# Patient Record
Sex: Female | Born: 1960 | Race: White | Hispanic: No | Marital: Married | Smoking: Never smoker
Health system: Southern US, Community
[De-identification: ages and names within clinical notes are randomized; demographics above are authoritative.]

## PROBLEM LIST (undated history)

## (undated) DIAGNOSIS — E039 Hypothyroidism, unspecified: Secondary | ICD-10-CM

## (undated) DIAGNOSIS — C801 Malignant (primary) neoplasm, unspecified: Secondary | ICD-10-CM

## (undated) DIAGNOSIS — D649 Anemia, unspecified: Secondary | ICD-10-CM

## (undated) DIAGNOSIS — J40 Bronchitis, not specified as acute or chronic: Secondary | ICD-10-CM

## (undated) DIAGNOSIS — D229 Melanocytic nevi, unspecified: Secondary | ICD-10-CM

## (undated) HISTORY — PX: OTHER SURGICAL HISTORY: SHX169

## (undated) HISTORY — PX: HERNIA REPAIR: SHX51

## (undated) HISTORY — PX: TUBAL LIGATION: SHX77

## (undated) HISTORY — PX: TONSILLECTOMY: SUR1361

## (undated) HISTORY — DX: Melanocytic nevi, unspecified: D22.9

## (undated) HISTORY — PX: BREAST RECONSTRUCTION: SHX9

---

## 2004-10-11 ENCOUNTER — Ambulatory Visit: Payer: Self-pay | Admitting: Obstetrics and Gynecology

## 2006-01-23 ENCOUNTER — Ambulatory Visit: Payer: Self-pay | Admitting: Obstetrics and Gynecology

## 2007-03-24 ENCOUNTER — Ambulatory Visit: Payer: Self-pay | Admitting: Obstetrics and Gynecology

## 2008-03-25 ENCOUNTER — Ambulatory Visit: Payer: Self-pay | Admitting: Obstetrics and Gynecology

## 2009-06-23 ENCOUNTER — Ambulatory Visit: Payer: Self-pay | Admitting: Obstetrics and Gynecology

## 2010-06-27 ENCOUNTER — Ambulatory Visit: Payer: Self-pay | Admitting: Obstetrics and Gynecology

## 2011-07-03 ENCOUNTER — Ambulatory Visit: Payer: Self-pay | Admitting: Obstetrics and Gynecology

## 2011-12-07 ENCOUNTER — Ambulatory Visit: Payer: Self-pay | Admitting: Unknown Physician Specialty

## 2012-07-15 ENCOUNTER — Ambulatory Visit: Payer: Self-pay | Admitting: Obstetrics and Gynecology

## 2012-07-31 ENCOUNTER — Ambulatory Visit: Payer: Self-pay | Admitting: Otolaryngology

## 2012-08-27 HISTORY — PX: MASTECTOMY: SHX3

## 2013-04-16 ENCOUNTER — Ambulatory Visit: Payer: Self-pay | Admitting: Obstetrics and Gynecology

## 2013-05-04 LAB — PATHOLOGY REPORT

## 2014-02-23 ENCOUNTER — Emergency Department: Payer: Self-pay | Admitting: Emergency Medicine

## 2014-02-23 LAB — URINALYSIS, COMPLETE
Bacteria: NONE SEEN
Bilirubin,UR: NEGATIVE
Blood: NEGATIVE
GLUCOSE, UR: NEGATIVE mg/dL (ref 0–75)
Ketone: NEGATIVE
Leukocyte Esterase: NEGATIVE
Nitrite: NEGATIVE
PH: 6 (ref 4.5–8.0)
Protein: NEGATIVE
RBC,UR: NONE SEEN /HPF (ref 0–5)
Specific Gravity: 1.004 (ref 1.003–1.030)
Squamous Epithelial: <1
WBC UR: NONE SEEN /HPF (ref 0–5)

## 2014-02-23 LAB — CBC
HCT: 41.4 % (ref 35.0–47.0)
HGB: 13.4 g/dL (ref 12.0–16.0)
MCH: 28.6 pg (ref 26.0–34.0)
MCHC: 32.4 g/dL (ref 32.0–36.0)
MCV: 88 fL (ref 80–100)
Platelet: 205 10*3/uL (ref 150–440)
RBC: 4.68 10*6/uL (ref 3.80–5.20)
RDW: 13.1 % (ref 11.5–14.5)
WBC: 5.8 10*3/uL (ref 3.6–11.0)

## 2014-02-23 LAB — COMPREHENSIVE METABOLIC PANEL
ALK PHOS: 37 U/L — AB
AST: 18 U/L (ref 15–37)
Albumin: 3.5 g/dL (ref 3.4–5.0)
Anion Gap: 12 (ref 7–16)
BILIRUBIN TOTAL: 0.4 mg/dL (ref 0.2–1.0)
BUN: 9 mg/dL (ref 7–18)
CHLORIDE: 106 mmol/L (ref 98–107)
CO2: 21 mmol/L (ref 21–32)
Calcium, Total: 8.8 mg/dL (ref 8.5–10.1)
Creatinine: 0.71 mg/dL (ref 0.60–1.30)
EGFR (African American): >60
EGFR (Non-African Amer.): >60
Glucose: 104 mg/dL — ABNORMAL HIGH (ref 65–99)
Osmolality: 277 (ref 275–301)
Potassium: 3.8 mmol/L (ref 3.5–5.1)
SGPT (ALT): 15 U/L (ref 12–78)
Sodium: 139 mmol/L (ref 136–145)
Total Protein: 6.6 g/dL (ref 6.4–8.2)

## 2014-02-23 LAB — TROPONIN I: Troponin-I: <0.02 ng/mL

## 2014-02-23 LAB — D-DIMER(ARMC): D-DIMER: 911 ng/mL

## 2014-02-23 LAB — LIPASE, BLOOD: LIPASE: 169 U/L (ref 73–393)

## 2014-05-19 ENCOUNTER — Emergency Department: Payer: Self-pay | Admitting: Emergency Medicine

## 2014-05-20 LAB — CBC WITH DIFFERENTIAL/PLATELET
Basophil #: 0 10*3/uL (ref 0.0–0.1)
Basophil %: 0.6 %
EOS ABS: 0.3 10*3/uL (ref 0.0–0.7)
EOS PCT: 4.2 %
HCT: 35.9 % (ref 35.0–47.0)
HGB: 12 g/dL (ref 12.0–16.0)
LYMPHS ABS: 2.1 10*3/uL (ref 1.0–3.6)
LYMPHS PCT: 31.9 %
MCH: 29.5 pg (ref 26.0–34.0)
MCHC: 33.3 g/dL (ref 32.0–36.0)
MCV: 89 fL (ref 80–100)
MONOS PCT: 8.7 %
Monocyte #: 0.6 x10 3/mm (ref 0.2–0.9)
Neutrophil #: 3.6 10*3/uL (ref 1.4–6.5)
Neutrophil %: 54.6 %
Platelet: 218 10*3/uL (ref 150–440)
RBC: 4.05 10*6/uL (ref 3.80–5.20)
RDW: 13 % (ref 11.5–14.5)
WBC: 6.5 10*3/uL (ref 3.6–11.0)

## 2014-05-20 LAB — BASIC METABOLIC PANEL
ANION GAP: 8 (ref 7–16)
BUN: 12 mg/dL (ref 7–18)
CALCIUM: 7.9 mg/dL — AB (ref 8.5–10.1)
CHLORIDE: 109 mmol/L — AB (ref 98–107)
CREATININE: 0.6 mg/dL (ref 0.60–1.30)
Co2: 23 mmol/L (ref 21–32)
EGFR (African American): >60
EGFR (Non-African Amer.): >60
Glucose: 101 mg/dL — ABNORMAL HIGH (ref 65–99)
Osmolality: 279 (ref 275–301)
POTASSIUM: 3.7 mmol/L (ref 3.5–5.1)
Sodium: 140 mmol/L (ref 136–145)

## 2014-05-20 LAB — TSH: THYROID STIMULATING HORM: 2.23 u[IU]/mL

## 2014-07-16 ENCOUNTER — Ambulatory Visit: Payer: Self-pay | Admitting: Unknown Physician Specialty

## 2014-07-25 IMAGING — US ULTRASOUND LEFT BREAST
1 series · 9 of 9 positions shown · non-contrast
Comparison: Previous examinations, the most recent dated
07/15/2012.

REASON FOR EXAM: LT BR MASS 11 OCLOCK
COMMENTS:

PROCEDURE:     MAM - MAM US BREAST LEFT  - April 16, 2013  [DATE]
CLINICAL DATA: Mass felt by the patient in the upper inner left
breast for the past 5 days.
DIGITAL DIAGNOSTIC LEFT MAMMOGRAM WITH CAD AND LEFT BREAST
ULTRASOUND:

[Series 1: ultrasound left breast · 0.08mm/px · 9 of 9 slices shown]
[im 1/9]
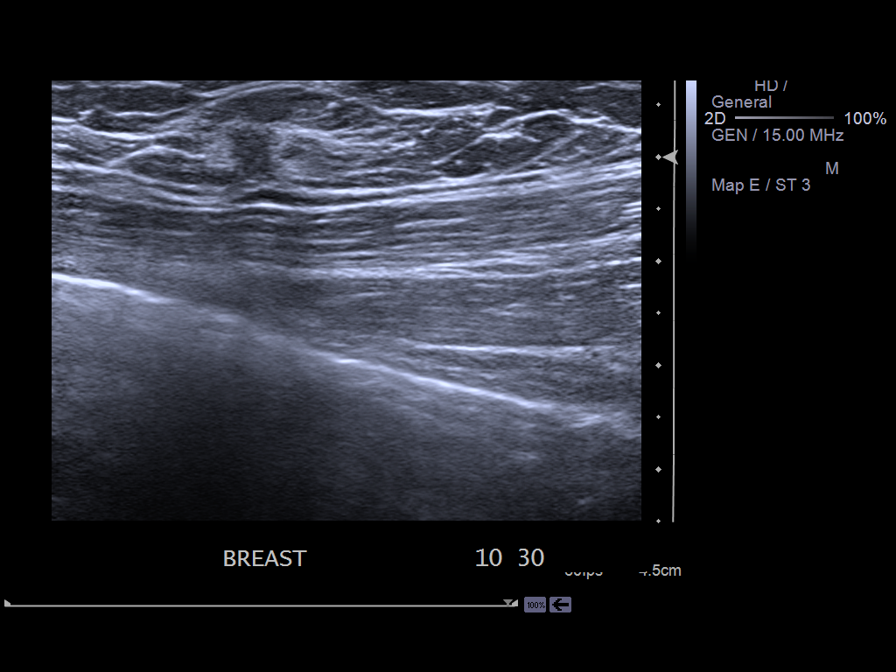
[im 2/9]
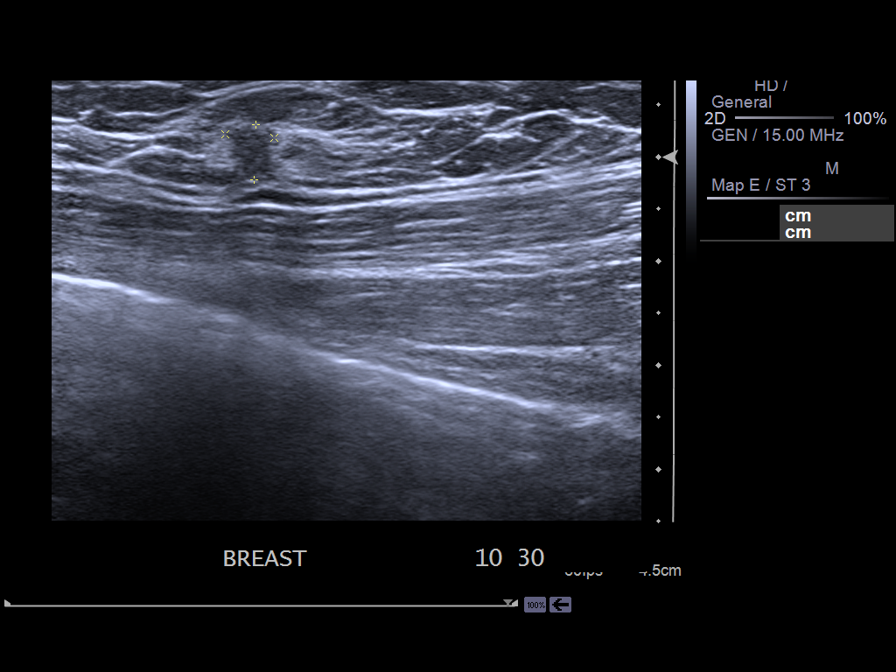
[im 3/9]
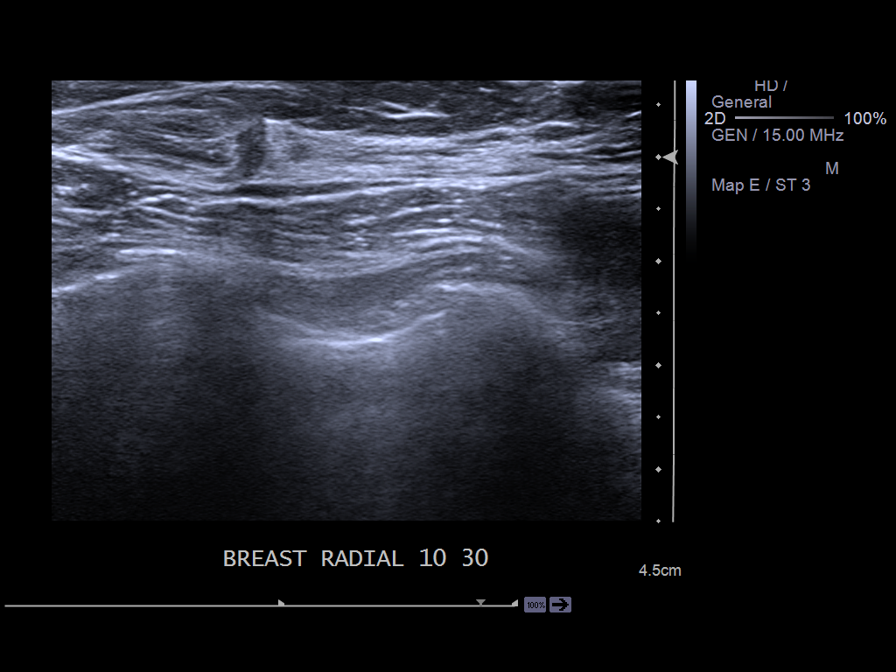
[im 4/9]
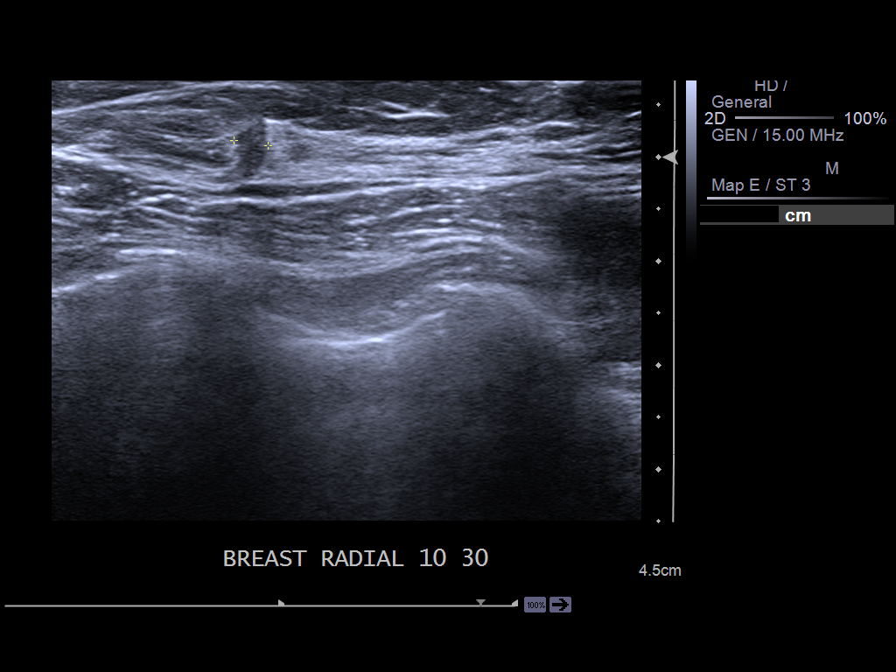
[im 5/9]
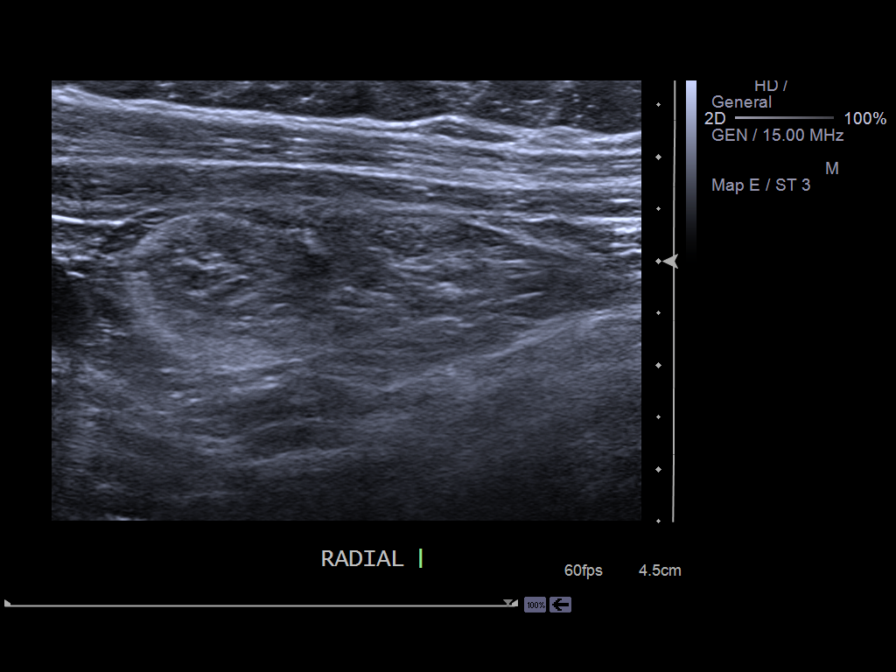
[im 6/9]
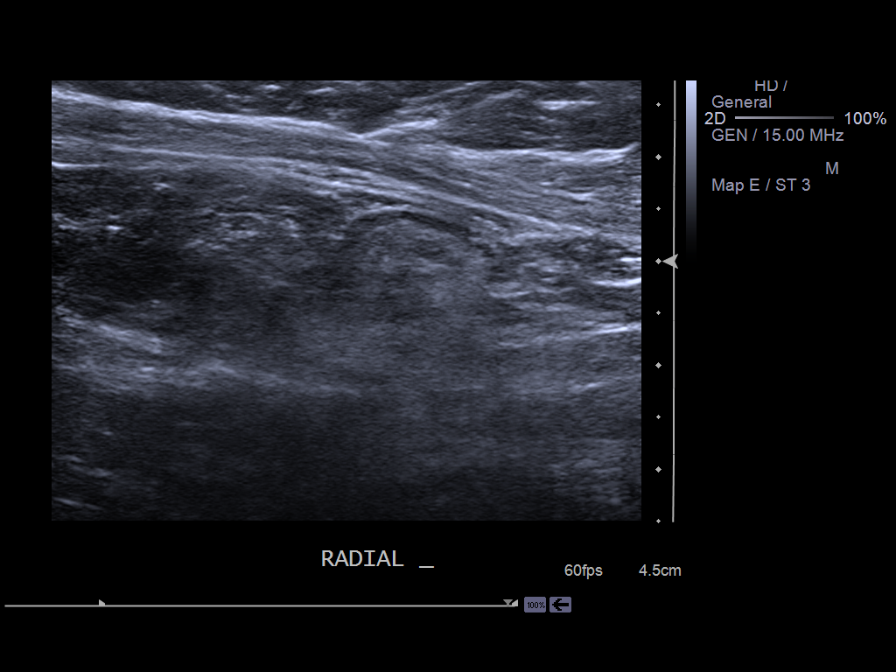
[im 7/9]
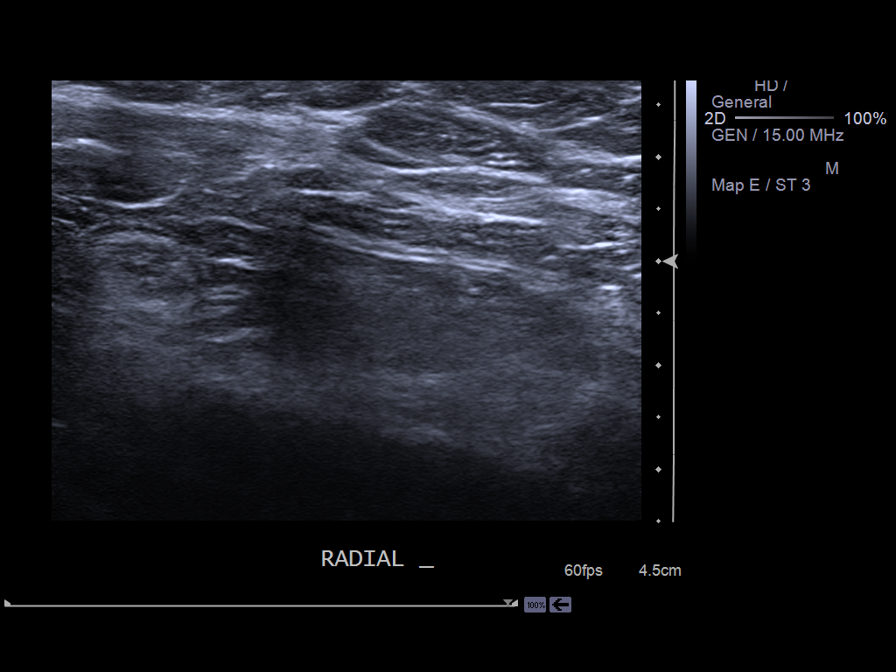
[im 8/9]
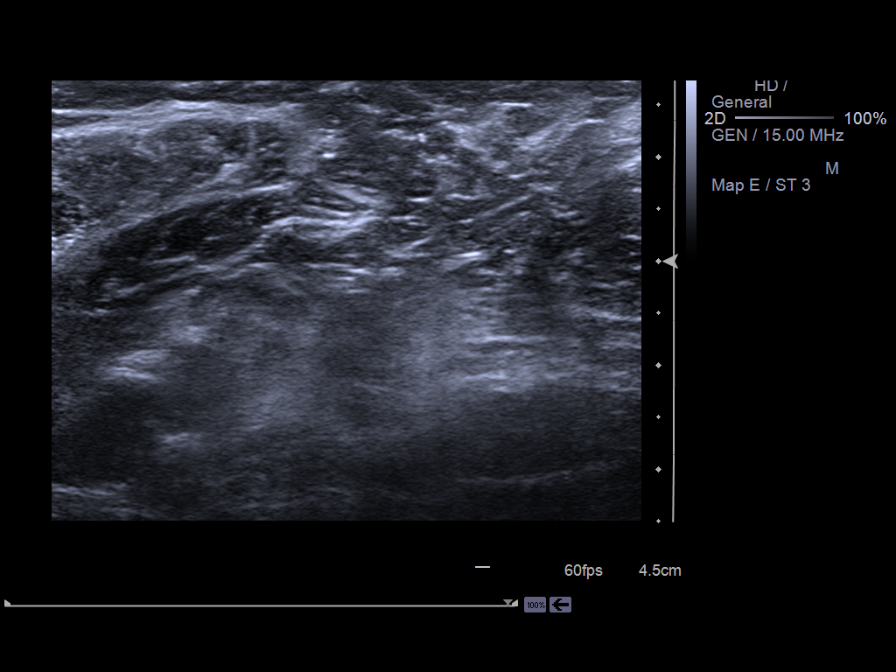
[im 9/9]
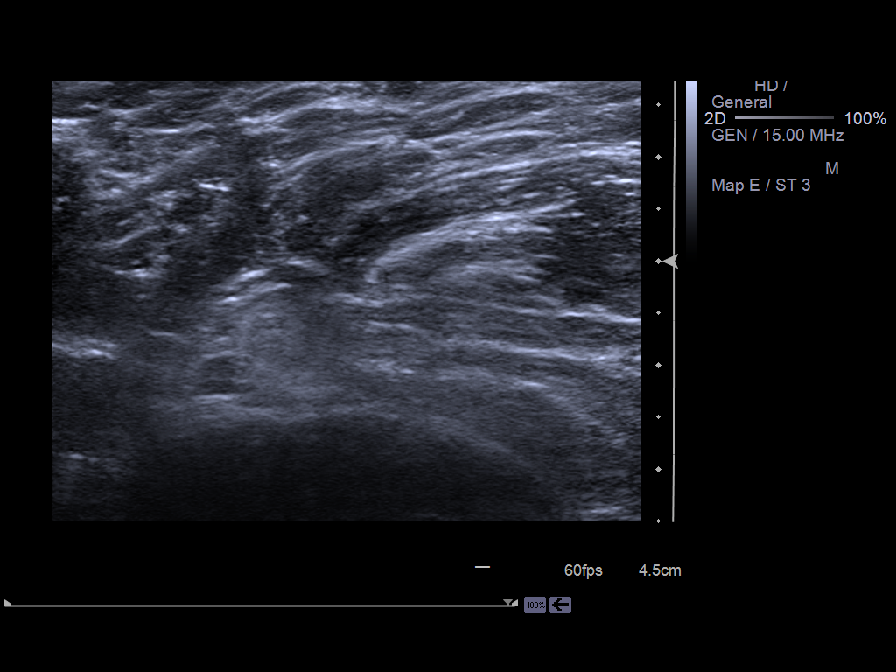

[9 of 9 positions shown; findings below may reference images not displayed]

FINDINGS: ACR Breast Density Category c:  The breast tissue is
heterogeneously dense, which may obscure small masses.

A subpectoral saline implant remains intact.  No discrete mass is
visible mammographically on these views, limited by the implant.

Mammographic images were processed with CAD.

On physical exam, the patient has an approximately 2 cm oval
palpable area of soft tissue thickening in the 10:30 o'clock
position of the left breast, 10 cm from the nipple.  There are no
palpable left axillary lymph nodes.

Ultrasound is performed, showing a 5 x 5 x 3 mm oval, vertically
oriented, irregularly marginated and poorly defined hypoechoic mass
in the 10:30 o'clock position of the left breast, 10 cm from the
nipple.  There is an ill defined surrounding echogenic halo.
Normal appearing left axillary lymph nodes were seen.
IMPRESSION: 5 x 5 x 3 mm mass in the 10:30 o'clock position of the left breast
with ultrasound features suspicious for malignancy.  Ultrasound-
guided core needle biopsy is recommended and scheduled for later
today.

RECOMMENDATION:
Left breast ultrasound guided core needle biopsy (scheduled for
later today).

I have discussed the findings and recommendations with the patient.
Results were also provided in writing at the conclusion of the
visit.  If applicable, a reminder letter will be sent to the
patient regarding the next appointment.

BI-RADS CATEGORY 5:  Highly suggestive of malignancy - appropriate
action should be taken.

## 2014-07-25 IMAGING — MG US BIOPSY BREAST CORE W/ IMAGING
1 series · 2 of 2 positions shown · non-contrast
Comparison: none

REASON FOR EXAM: us bx lt mass
COMMENTS:
CLINICAL DATA: 5 mm palpable mass in the upper inner quadrant of
the left breast with ultrasound features suspicious for malignancy.

[L CC · left · 2 of 2 slices shown]
[im 1/2]
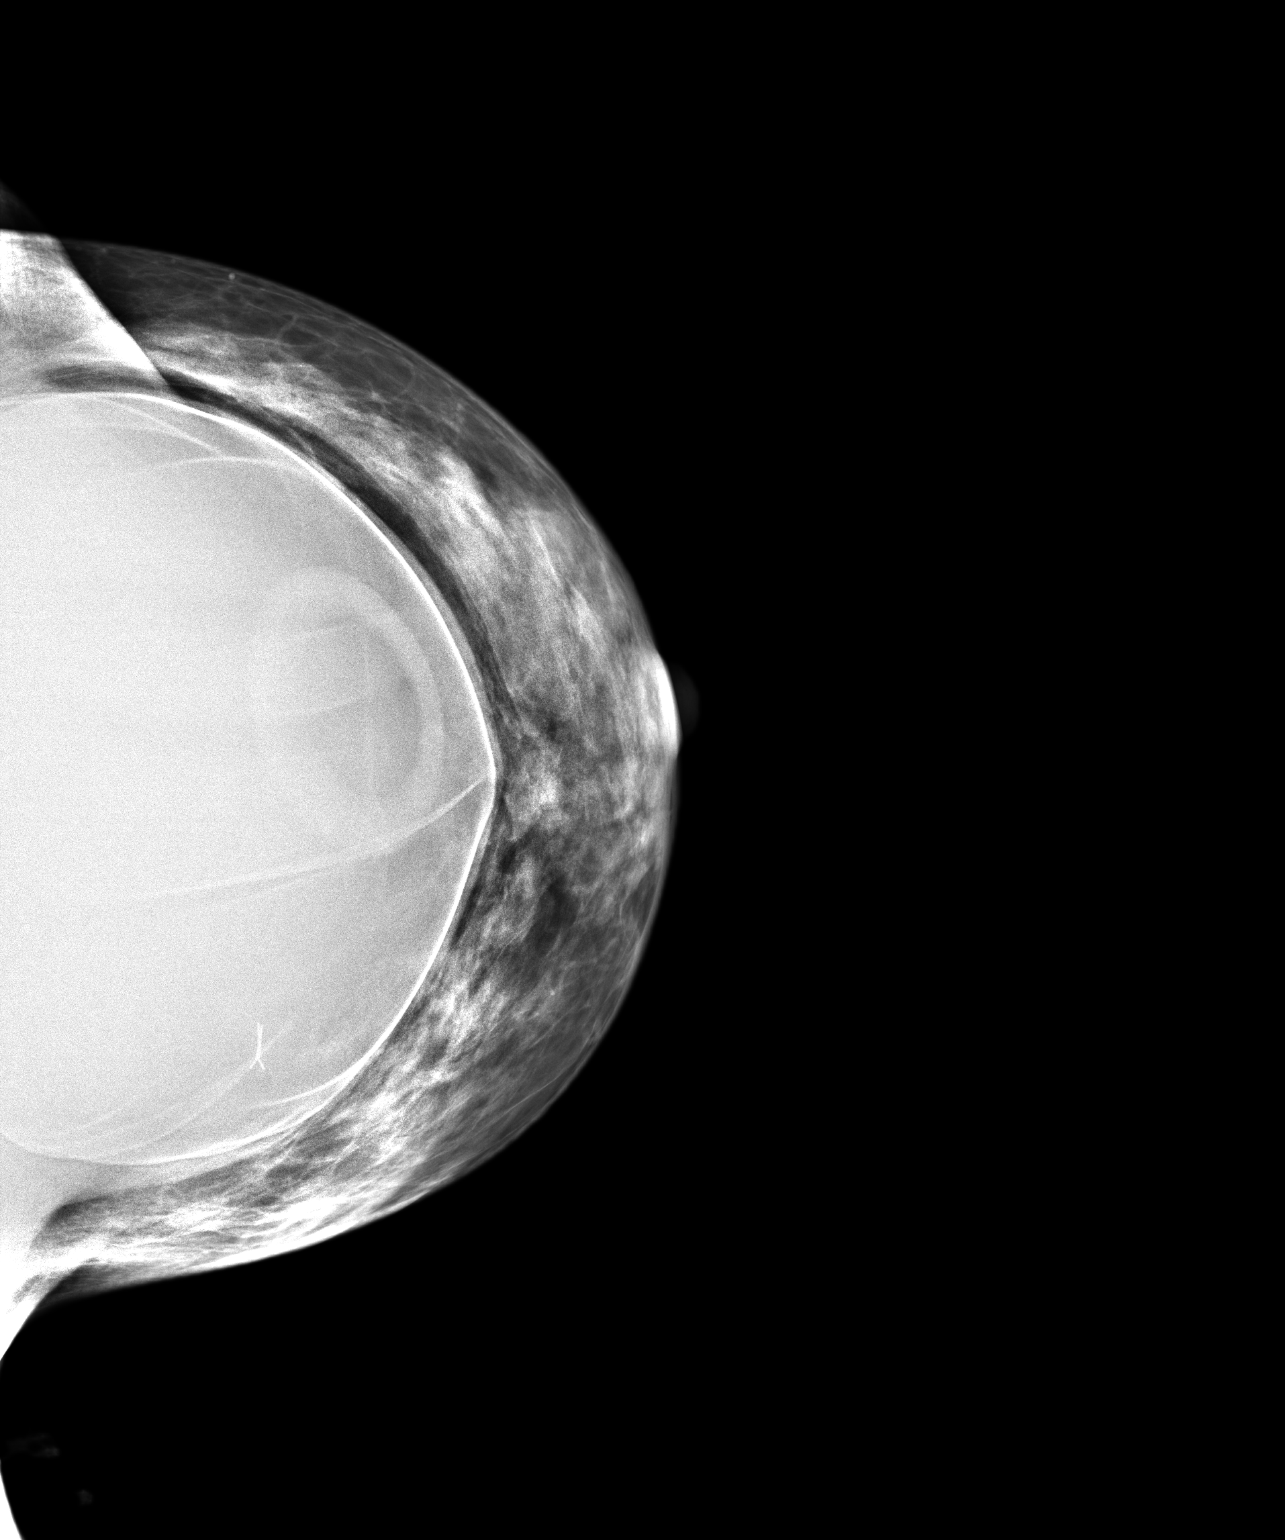
[im 2/2]
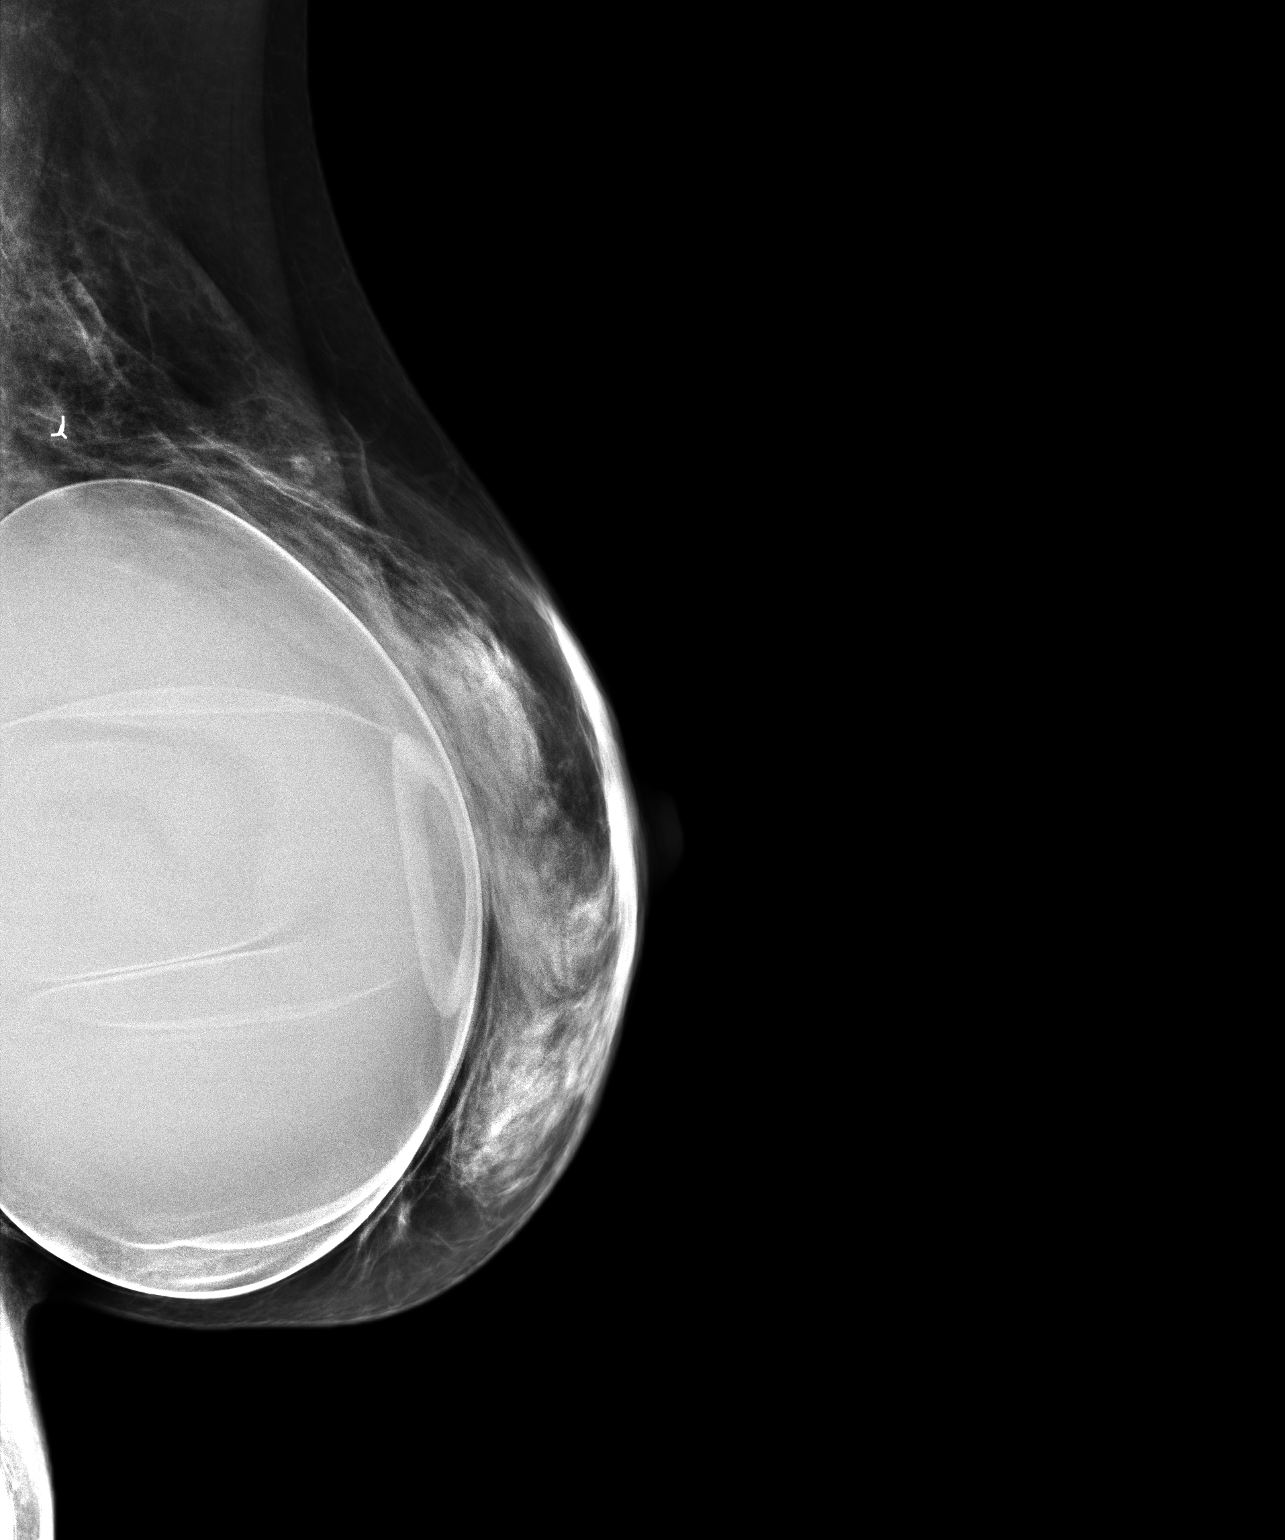

[2 of 2 positions shown; findings below may reference images not displayed]

PROCEDURE:     MAM - MAM US CORE BIOPSY LEFT  - April 16, 2013 [DATE]

***ADDENDUM*** CREATED: 04/21/2013 [DATE]

The final pathological diagnosis is invasive mammary carcinoma.
This is concordant with the imaging findings.

The final pathological diagnosis was discussed with the patient by
telephone on 04/21/2013.  Her questions were answered.  She reported
some pain and swelling at the biopsy site with no bruising or
palpable hematoma.

She reported discussing the final pathological diagnosis with Dr.
Katsunari yesterday.  He is arranging a follow-up treatment plan.
ULTRASOUND GUIDED VACUUM ASSISTED CORE BIOPSY OF THE LEFT BREAST

I met with the patient, and we discussed the procedure of
ultrasound-guided biopsy, including risks.  Specifically, we
discussed the risks of bleeding, infection and clip migration.  We
discussed the high likelihood of a successful procedure.  Informed,
written consent was given.

Using sterile technique, local anesthesia, ultrasound guidance, and
a 12 gauge vacuum assisted needle, biopsy was performed of the 5 mm
mass seen in the 10:30 o'clock position of the left breast at
ultrasound earlier today.  A medial approach was used.  At the
conclusion of the procedure, a tissue marker clip was deployed into
the biopsy cavity.  A follow-up 2-view mammogram was performed and
dictated separately.
IMPRESSION: Ultrasound-guided biopsy of a 5 mm mass in the 10:30 o'clock
position of the left breast.  No apparent complications.

## 2014-12-20 LAB — SURGICAL PATHOLOGY

## 2015-02-10 ENCOUNTER — Encounter
Admission: RE | Admit: 2015-02-10 | Discharge: 2015-02-10 | Disposition: A | Discharge status: Home / Self Care | Payer: 59 | Source: Ambulatory Visit | Attending: Obstetrics and Gynecology | Admitting: Obstetrics and Gynecology

## 2015-02-10 DIAGNOSIS — Z01812 Encounter for preprocedural laboratory examination: Secondary | ICD-10-CM | POA: Insufficient documentation

## 2015-02-10 DIAGNOSIS — Z853 Personal history of malignant neoplasm of breast: Secondary | ICD-10-CM | POA: Insufficient documentation

## 2015-02-10 DIAGNOSIS — Z880 Allergy status to penicillin: Secondary | ICD-10-CM | POA: Diagnosis not present

## 2015-02-10 DIAGNOSIS — Z9010 Acquired absence of unspecified breast and nipple: Secondary | ICD-10-CM | POA: Diagnosis not present

## 2015-02-10 DIAGNOSIS — E039 Hypothyroidism, unspecified: Secondary | ICD-10-CM | POA: Insufficient documentation

## 2015-02-10 DIAGNOSIS — J40 Bronchitis, not specified as acute or chronic: Secondary | ICD-10-CM | POA: Insufficient documentation

## 2015-02-10 HISTORY — DX: Malignant (primary) neoplasm, unspecified: C80.1

## 2015-02-10 HISTORY — DX: Bronchitis, not specified as acute or chronic: J40

## 2015-02-10 HISTORY — DX: Hypothyroidism, unspecified: E03.9

## 2015-02-10 LAB — CBC
HCT: 38.1 % (ref 35.0–47.0)
Hemoglobin: 12.5 g/dL (ref 12.0–16.0)
MCH: 29.4 pg (ref 26.0–34.0)
MCHC: 32.8 g/dL (ref 32.0–36.0)
MCV: 89.6 fL (ref 80.0–100.0)
PLATELETS: 201 10*3/uL (ref 150–440)
RBC: 4.25 MIL/uL (ref 3.80–5.20)
RDW: 13.7 % (ref 11.5–14.5)
WBC: 6.4 10*3/uL (ref 3.6–11.0)

## 2015-02-10 LAB — BASIC METABOLIC PANEL
Anion gap: 3 — ABNORMAL LOW (ref 5–15)
BUN: 13 mg/dL (ref 6–20)
CHLORIDE: 107 mmol/L (ref 101–111)
CO2: 25 mmol/L (ref 22–32)
Calcium: 8.9 mg/dL (ref 8.9–10.3)
Creatinine, Ser: 0.68 mg/dL (ref 0.44–1.00)
GFR calc Af Amer: >60 mL/min (ref >60)
GFR calc non Af Amer: >60 mL/min (ref >60)
GLUCOSE: 94 mg/dL (ref 65–99)
POTASSIUM: 3.7 mmol/L (ref 3.5–5.1)
Sodium: 135 mmol/L (ref 135–145)

## 2015-02-10 LAB — TYPE AND SCREEN
ABO/RH(D): O POS
Antibody Screen: NEGATIVE

## 2015-02-10 NOTE — H&P (Signed)
Judith West is a 54 y.o. female here for fractional D+C and hysteroscopy  .PMB on tamoxifen . Attempted EMBX without identifiable tissue 10/2014, u/s 12/02/14 shows stripe to be 4.9 mm Pt continues to have bleeding .    Past Medical History:  has a past medical history of Cancer; Thyroid disease; Acid reflux; Vitamin D deficiency; Breast CA; History of abdominal hernia; Leucopenia (2/06); Hypothyroidism; Stenosis of ureteropelvic junction (UPJ); and GERD (gastroesophageal reflux disease).  Past Surgical History:  has past surgical history that includes Augmentation mammaplasty; Hernia repair; mastectomy bilateral simple (Bilateral, 05/28/2013); biopsy/excision lymph node axillary (Left, 05/28/2013); reconstruction bilateral breast immediate post mastopexy w/implant (Bilateral, 05/28/2013); revision of reconstructed breast (Bilateral, 12/31/2013); reconstruction nipple &/or areola (Right, 12/31/2013); Endometrial ablation; Abdominal wall hernia repair (2005); Tubal ligation (Bilateral); colonoscopy (12/07/2011); egd (12/07/2011); and revision of reconstructed breast (Bilateral, 05/13/2014). Family History: family history includes Breast cancer in her other; Colon cancer in her other; Diabetes mellitus in her brother; Hyperlipidemia in her other; Hypothyroidism in her other. Social History:  reports that she has quit smoking. Her smoking use included Cigarettes. She has a .75 pack-year smoking history. She has never used smokeless tobacco. She reports that she drinks about 3.0 oz of alcohol per week. She reports that she does not use illicit drugs. OB/GYN History:  OB History    Gravida Para Term Preterm AB TAB SAB Ectopic Multiple Living   3 3 3       3       Allergies: is allergic to penicillin g. Medications:  Current outpatient prescriptions:  . aspirin 81 MG EC tablet, Take 81 mg by mouth daily., Disp: , Rfl:  . ferrous sulfate 325 (65 FE) MG EC tablet, Take 325 mg by mouth  daily with breakfast., Disp: , Rfl:  . GLUCOSAMINE HCL/CHONDR SU A NA (OSTEO BI-FLEX ORAL), Take by mouth., Disp: , Rfl:  . L. ACIDOPHILUS/BIFID. ANIMALIS (ONE-A-DAY TRUBIOTICS ORAL), Take by mouth., Disp: , Rfl:  . levothyroxine (SYNTHROID, LEVOTHROID) 100 MCG tablet, Take 1 tablet (100 mcg total) by mouth once daily. Take on an empty stomach with a glass of water at least 30-60 minutes before breakfast., Disp: 30 tablet, Rfl: 11 . MULTIVITAMIN ORAL, Take by mouth., Disp: , Rfl:  . omega-3 fatty acids-fish oil (FISH OIL) 360-1,200 mg Cap, Take by mouth., Disp: , Rfl:  . tamoxifen (NOLVADEX) 20 MG tablet, Take 1 tablet (20 mg total) by mouth once daily., Disp: 30 tablet, Rfl: 11  Review of Systems: General: No fatigue or weight loss Eyes:No vision changes Ears:No hearing difficulty Respiratory: No cough or shortness of breath Pulmonary: No asthma or shortness of breath Cardiovascular: No chest pain, palpitations, dyspnea on exertion Gastrointestinal: No abdominal bloating, chronic diarrhea, constipations, masses, pain or hematochezia Genitourinary:No hematuria, dysuria, abnormal vaginal discharge, pelvic pain, + PMB Lymphatic:No swollen lymph nodes Musculoskeletal:No muscle weakness Neurologic:No extremity weakness, syncope, seizure disorder Psychiatric:No history of depression, delusions or suicidal/homicidal ideation   Exam:   Filed Vitals:   02/08/15 1404  BP: 122/75  Pulse: 64    Body mass index is 22.3 kg/(m^2).   WDWN white/ female in NAD  Lungs: CTA  CV : RRR without murmur   pelvic : v/v nl  Cx: no lesions  Utx : NNS  Non tender  Adnexa: non tender      Impression:   The encounter diagnosis was PMB (postmenopausal bleeding).  On  tamoxifen , persistent     Plan:  Fractional d+c ,hysteroscopy

## 2015-02-10 NOTE — Patient Instructions (Signed)
  Your procedure is scheduled on: 02/14/15 Mon Report to Day Surgery. To find out your arrival time please call [phone] between 1PM - 3PM on 02/11/15 Fri.  Remember: Instructions that are not followed completely may result in serious medical risk, up to and including death, or upon the discretion of your surgeon and anesthesiologist your surgery may need to be rescheduled.    __x__ 1. Do not eat food or drink liquids after midnight. No gum chewing or hard candies.     __x__ 2. No Alcohol for 24 hours before or after surgery.   ____ 3. Bring all medications with you on the day of surgery if instructed.    __x__ 4. Notify your doctor if there is any change in your medical condition     (cold, fever, infections).     Do not wear jewelry, make-up, hairpins, clips or nail polish.  Do not wear lotions, powders, or perfumes. You may wear deodorant.  Do not shave 48 hours prior to surgery. Men may shave face and neck.  Do not bring valuables to the hospital.    [facility] is not responsible for any belongings or valuables.               Contacts, dentures or bridgework may not be worn into surgery.  Leave your suitcase in the car. After surgery it may be brought to your room.  For patients admitted to the hospital, discharge time is determined by your                treatment team.   Patients discharged the day of surgery will not be allowed to drive home.   Please read over the following fact sheets that you were given:      _x___ Take these medicines the morning of surgery with A SIP OF WATER:    1.levothyroxine (SYNTHROID, LEVOTHROID) 100 MCG tabl  2.   3.   4.  5.  6.  ____ Fleet Enema (as directed)   ____ Use CHG Soap as directed  ____ Use inhalers on the day of surgery  ____ Stop metformin 2 days prior to surgery    ____ Take 1/2 of usual insulin dose the night before surgery and none on the morning of surgery.   _x___ Stop Coumadin/Plavix/aspirin on Stop aspirin  on Tues 6/14  ____ Stop Anti-inflammatories on   __x__ Stop supplements until after surgery.  Fish oils  ____ Bring C-Pap to the hospital.

## 2015-02-11 ENCOUNTER — Other Ambulatory Visit: Payer: Self-pay

## 2015-02-11 LAB — ABO/RH: ABO/RH(D): O POS

## 2015-02-14 ENCOUNTER — Encounter: Payer: Self-pay | Admitting: *Deleted

## 2015-02-14 ENCOUNTER — Ambulatory Visit: Payer: 59 | Admitting: Anesthesiology

## 2015-02-14 ENCOUNTER — Ambulatory Visit
Admission: RE | Admit: 2015-02-14 | Discharge: 2015-02-14 | Disposition: A | Discharge status: Home / Self Care | Payer: 59 | Source: Ambulatory Visit | Attending: Obstetrics and Gynecology | Admitting: Obstetrics and Gynecology

## 2015-02-14 ENCOUNTER — Encounter
Admission: RE | Disposition: A | Discharge status: Home / Self Care | Payer: Self-pay | Source: Ambulatory Visit | Attending: Obstetrics and Gynecology

## 2015-02-14 DIAGNOSIS — Z9013 Acquired absence of bilateral breasts and nipples: Secondary | ICD-10-CM | POA: Diagnosis not present

## 2015-02-14 DIAGNOSIS — E039 Hypothyroidism, unspecified: Secondary | ICD-10-CM | POA: Insufficient documentation

## 2015-02-14 DIAGNOSIS — Z853 Personal history of malignant neoplasm of breast: Secondary | ICD-10-CM | POA: Insufficient documentation

## 2015-02-14 DIAGNOSIS — Z87891 Personal history of nicotine dependence: Secondary | ICD-10-CM | POA: Insufficient documentation

## 2015-02-14 DIAGNOSIS — N950 Postmenopausal bleeding: Secondary | ICD-10-CM | POA: Diagnosis present

## 2015-02-14 DIAGNOSIS — Q6211 Congenital occlusion of ureteropelvic junction: Secondary | ICD-10-CM | POA: Insufficient documentation

## 2015-02-14 DIAGNOSIS — Z7982 Long term (current) use of aspirin: Secondary | ICD-10-CM | POA: Diagnosis not present

## 2015-02-14 DIAGNOSIS — Z9889 Other specified postprocedural states: Secondary | ICD-10-CM | POA: Insufficient documentation

## 2015-02-14 DIAGNOSIS — Z79899 Other long term (current) drug therapy: Secondary | ICD-10-CM | POA: Insufficient documentation

## 2015-02-14 DIAGNOSIS — E559 Vitamin D deficiency, unspecified: Secondary | ICD-10-CM | POA: Insufficient documentation

## 2015-02-14 DIAGNOSIS — Z880 Allergy status to penicillin: Secondary | ICD-10-CM | POA: Insufficient documentation

## 2015-02-14 DIAGNOSIS — K219 Gastro-esophageal reflux disease without esophagitis: Secondary | ICD-10-CM | POA: Diagnosis not present

## 2015-02-14 DIAGNOSIS — D72819 Decreased white blood cell count, unspecified: Secondary | ICD-10-CM | POA: Insufficient documentation

## 2015-02-14 DIAGNOSIS — Z79810 Long term (current) use of selective estrogen receptor modulators (SERMs): Secondary | ICD-10-CM | POA: Insufficient documentation

## 2015-02-14 HISTORY — PX: HYSTEROSCOPY W/D&C: SHX1775

## 2015-02-14 LAB — POCT PREGNANCY, URINE: Preg Test, Ur: NEGATIVE

## 2015-02-14 SURGERY — DILATATION AND CURETTAGE /HYSTEROSCOPY
Anesthesia: General

## 2015-02-14 MED ORDER — FENTANYL CITRATE (PF) 100 MCG/2ML IJ SOLN
INTRAMUSCULAR | Status: DC | PRN
  Administered 2015-02-14: 100 ug via INTRAVENOUS

## 2015-02-14 MED ORDER — MIDAZOLAM HCL 2 MG/2ML IJ SOLN
INTRAMUSCULAR | Status: DC | PRN
  Administered 2015-02-14: 2 mg via INTRAVENOUS

## 2015-02-14 MED ORDER — LACTATED RINGERS IV SOLN
INTRAVENOUS | Status: DC
  Administered 2015-02-14: 12:00:00 via INTRAVENOUS

## 2015-02-14 MED ORDER — LACTATED RINGERS IV SOLN
INTRAVENOUS | Status: DC
  Administered 2015-02-14: 11:00:00 via INTRAVENOUS

## 2015-02-14 MED ORDER — ONDANSETRON HCL 4 MG/2ML IJ SOLN: 4.0000 mg | Freq: Once | INTRAMUSCULAR | Status: DC | PRN

## 2015-02-14 MED ORDER — CEFAZOLIN SODIUM 1-5 GM-% IV SOLN
INTRAVENOUS | Status: AC
  Administered 2015-02-14: 1000 mg
  Filled 2015-02-14: qty 50

## 2015-02-14 MED ORDER — FENTANYL CITRATE (PF) 100 MCG/2ML IJ SOLN: 25.0000 ug | INTRAMUSCULAR | Status: DC | PRN

## 2015-02-14 MED ORDER — SILVER NITRATE-POT NITRATE 75-25 % EX MISC
CUTANEOUS | Status: AC
Start: 2015-02-14 — End: 2015-02-14
  Filled 2015-02-14: qty 5

## 2015-02-14 MED ORDER — CEFAZOLIN (ANCEF) 1 G IV SOLR
1.0000 g | INTRAVENOUS | Status: DC
  Filled 2015-02-14: qty 1

## 2015-02-14 MED ORDER — ONDANSETRON HCL 4 MG/2ML IJ SOLN
INTRAMUSCULAR | Status: DC | PRN
  Administered 2015-02-14: 4 mg via INTRAVENOUS

## 2015-02-14 MED ORDER — LIDOCAINE HCL (CARDIAC) 20 MG/ML IV SOLN
INTRAVENOUS | Status: DC | PRN
  Administered 2015-02-14: 150 mg via INTRAVENOUS
  Administered 2015-02-14: 100 mg via INTRAVENOUS

## 2015-02-14 MED ORDER — FAMOTIDINE 20 MG PO TABS
ORAL_TABLET | ORAL | Status: AC
  Administered 2015-02-14: 20 mg via ORAL
  Filled 2015-02-14: qty 1

## 2015-02-14 MED ORDER — FAMOTIDINE 20 MG PO TABS
20.0000 mg | ORAL_TABLET | Freq: Once | ORAL | Status: AC
  Administered 2015-02-14: 20 mg via ORAL

## 2015-02-14 MED ORDER — NAPROXEN 500 MG PO TABS: 500.0000 mg | ORAL_TABLET | Freq: Two times a day (BID) | ORAL | Status: DC

## 2015-02-14 SURGICAL SUPPLY — 16 items
CANISTER SUCT 3000ML (MISCELLANEOUS) ×3 IMPLANT
CATH ROBINSON RED A/P 16FR (CATHETERS) ×3 IMPLANT
GLOVE BIO SURGEON STRL SZ8 (GLOVE) ×3 IMPLANT
GOWN STRL REUS W/ TWL LRG LVL3 (GOWN DISPOSABLE) ×1 IMPLANT
GOWN STRL REUS W/ TWL XL LVL3 (GOWN DISPOSABLE) ×1 IMPLANT
GOWN STRL REUS W/TWL LRG LVL3 (GOWN DISPOSABLE) ×2
GOWN STRL REUS W/TWL XL LVL3 (GOWN DISPOSABLE) ×2
IV LACTATED RINGERS 1000ML (IV SOLUTION) ×3 IMPLANT
KIT RM TURNOVER CYSTO AR (KITS) ×3 IMPLANT
PACK DNC HYST (MISCELLANEOUS) ×3 IMPLANT
PAD GROUND ADULT SPLIT (MISCELLANEOUS) ×3 IMPLANT
PAD OB MATERNITY 4.3X12.25 (PERSONAL CARE ITEMS) ×3 IMPLANT
PAD PREP 24X41 OB/GYN DISP (PERSONAL CARE ITEMS) ×3 IMPLANT
TOWEL OR 17X26 4PK STRL BLUE (TOWEL DISPOSABLE) ×3 IMPLANT
TUBING CONNECTING 10 (TUBING) ×2 IMPLANT
TUBING CONNECTING 10' (TUBING) ×1

## 2015-02-14 NOTE — Anesthesia Postprocedure Evaluation (Signed)
  Anesthesia Post-op Note  Patient: Judith West  Procedure(s) Performed: Procedure(s): DILATATION AND CURETTAGE /HYSTEROSCOPY (N/A)  Anesthesia type:General  Patient location: PACU  Post pain: Pain level controlled  Post assessment: Post-op Vital signs reviewed, Patient's Cardiovascular Status Stable, Respiratory Function Stable, Patent Airway and No signs of Nausea or vomiting  Post vital signs: Reviewed and stable  Last Vitals:  Filed Vitals:   02/14/15 1340  BP: 130/60  Pulse: 53  Temp: 36.4 C  Resp: 12    Level of consciousness: awake, alert  and patient cooperative  Complications: No apparent anesthesia complications

## 2015-02-14 NOTE — Brief Op Note (Signed)
02/14/2015  12:26 PM  PATIENT:  Judith West  54 y.o. female  PRE-OPERATIVE DIAGNOSIS:  PMB ON TAMAXIFEN  POST-OPERATIVE DIAGNOSIS:  PMB on tamaxifen  PROCEDURE:  Procedure(s): DILATATION AND CURETTAGE /HYSTEROSCOPY (N/A)  SURGEON:  Surgeon(s) and Role:    Boykin Nearing, MD - Primary  PHYSICIAN ASSISTANT:   ASSISTANTS: none   ANESTHESIA:   MAC, lma  EBL:  Total I/O In: 600 [I.V.:600] Out: 110 [Urine:100; Blood:10]  BLOOD ADMINISTERED:none  DRAINS: none   LOCAL MEDICATIONS USED:  NONE  SPECIMEN:  Source of Specimen:  ECC, endometrial currettings  DISPOSITION OF SPECIMEN:  PATHOLOGY  COUNTS:  YES  TOURNIQUET:  * No tourniquets in log *  DICTATION:verbal  PLAN OF CARE: Discharge to home after PACU  PATIENT DISPOSITION:  PACU - hemodynamically stable.   Delay start of Pharmacological VTE agent (>24hrs) due to surgical blood loss or risk of bleeding: not applicable

## 2015-02-14 NOTE — Anesthesia Preprocedure Evaluation (Signed)
Anesthesia Evaluation  Patient identified by MRN, date of birth, ID band Patient awake    Reviewed: Allergy & Precautions, NPO status , Patient's Chart, lab work & pertinent test results  History of Anesthesia Complications Negative for: history of anesthetic complications  Airway Mallampati: II  TM Distance: >3 FB Neck ROM: Full    Dental no notable dental hx.    Pulmonary neg pulmonary ROS,  breath sounds clear to auscultation  Pulmonary exam normal       Cardiovascular Exercise Tolerance: Good negative cardio ROS Normal cardiovascular examRhythm:Regular Rate:Normal     Neuro/Psych negative neurological ROS  negative psych ROS   GI/Hepatic negative GI ROS, Neg liver ROS,   Endo/Other  Hypothyroidism   Renal/GU negative Renal ROS  negative genitourinary   Musculoskeletal negative musculoskeletal ROS (+)   Abdominal   Peds negative pediatric ROS (+)  Hematology negative hematology ROS (+)   Anesthesia Other Findings   Reproductive/Obstetrics negative OB ROS                             Anesthesia Physical Anesthesia Plan  ASA: II  Anesthesia Plan: General   Post-op Pain Management:    Induction: Intravenous  Airway Management Planned: LMA  Additional Equipment:   Intra-op Plan:   Post-operative Plan: Extubation in OR  Informed Consent: I have reviewed the patients History and Physical, chart, labs and discussed the procedure including the risks, benefits and alternatives for the proposed anesthesia with the patient or authorized representative who has indicated his/her understanding and acceptance.   Dental advisory given  Plan Discussed with: CRNA and Surgeon  Anesthesia Plan Comments:         Anesthesia Quick Evaluation

## 2015-02-14 NOTE — Progress Notes (Signed)
Spoke with pt today . Ready for fractional d+c. No interval change  Labs wnl . All questions answered

## 2015-02-14 NOTE — Op Note (Signed)
NAME:  Judith West, Judith West NO.:  [id number]  MEDICAL RECORD NO.:  [id number]  LOCATION:  [facility]                         FACILITY:  [facility]  PHYSICIAN:  Laverta Baltimore, MDDATE OF BIRTH:  12-16-1960  DATE OF PROCEDURE:  02/14/2015 DATE OF DISCHARGE:  02/14/2015                              OPERATIVE REPORT   PREOPERATIVE DIAGNOSIS:  Postmenopausal bleeding, on tamoxifen.  POSTOPERATIVE DIAGNOSIS:  Postmenopausal bleeding, on tamoxifen.  PROCEDURES: 1. Fractional dilation and curettage. 2. Hysteroscopy.  ANESTHESIA:  MAC, LMA.  SURGEON:  Laverta Baltimore, MD.  INDICATION:  A 54 year old gravida 3, para 3 patient with postmenopausal bleeding and the patient is on tamoxifen.  Office endometrial biopsy showed insufficient tissue and the patient continues to have postmenopausal bleeding.  DESCRIPTION OF PROCEDURE:  After adequate MAC anesthesia, the patient was placed in dorsal supine position, legs in the [employer].  The lower abdominal, perineal and vaginal prep was performed with Betadine. The patient did receive 1 g IV Ancef prior to commencement of the case. The patient's bladder was catheterized, yielding 100 mL of clear urine. A weighted speculum was then placed in the posterior vaginal vault and the anterior cervix was grasped with a single-tooth tenaculum. Endocervical curettage was then performed.  Uterus was then sounded to 7.5 cm and the cervix was then dilated to #18 Hanks dilator without difficulty.  The hysteroscope was advanced into the endometrial cavity with Lactated Ringer's as the distending medium.  Endometrial cavity had some small fragments of endometrial tissue and otherwise was atrophic, small amount of residual blood.  Hysteroscope was removed and endometrial curetting was then performed.  Good uterine cry throughout the uterus.  Good hemostasis was noted.  Single-tooth tenaculum was removed and silver nitrate applied to the  tenaculum sites.  The patient tolerated the procedure well and was taken to the recovery room in good condition.          ______________________________ Laverta Baltimore, MD     TS/MEDQ  D:  02/14/2015  T:  02/14/2015  Job:  [id number]

## 2015-02-14 NOTE — Transfer of Care (Signed)
Immediate Anesthesia Transfer of Care Note  Patient: Judith West  Procedure(s) Performed: Procedure(s): DILATATION AND CURETTAGE /HYSTEROSCOPY (N/A)  Patient Location: PACU  Anesthesia Type:General  Level of Consciousness: awake  Airway & Oxygen Therapy: Patient connected to face mask oxygen  Post-op Assessment: Report given to RN  Post vital signs: Reviewed and stable  Last Vitals:  Filed Vitals:   02/14/15 1038  BP: 112/81  Pulse: 64  Temp: 36.8 C  Resp: 16    Complications: No apparent anesthesia complications

## 2015-02-14 NOTE — Anesthesia Procedure Notes (Signed)
Procedure Name: LMA Insertion Date/Time: 02/14/2015 12:00 PM Performed by: Nelda Marseille Pre-anesthesia Checklist: Patient identified, Patient being monitored, Timeout performed, Emergency Drugs available and Suction available Patient Re-evaluated:Patient Re-evaluated prior to inductionOxygen Delivery Method: Circle system utilized Preoxygenation: Pre-oxygenation with 100% oxygen Intubation Type: IV induction Ventilation: Mask ventilation without difficulty LMA: LMA inserted LMA Size: 3.5 Tube type: Oral Number of attempts: 1 Placement Confirmation: positive ETCO2 and breath sounds checked- equal and bilateral Tube secured with: Tape Dental Injury: Teeth and Oropharynx as per pre-operative assessment

## 2015-02-15 LAB — SURGICAL PATHOLOGY

## 2015-02-22 MED ORDER — SODIUM CHLORIDE 0.9 % IJ SOLN
INTRAMUSCULAR | Status: AC
  Filled 2015-02-22: qty 3

## 2015-05-19 ENCOUNTER — Other Ambulatory Visit: Payer: Self-pay | Admitting: Physician Assistant

## 2015-05-19 ENCOUNTER — Ambulatory Visit
Admission: RE | Admit: 2015-05-19 | Discharge: 2015-05-19 | Disposition: A | Discharge status: Home / Self Care | Payer: 59 | Source: Ambulatory Visit | Attending: Physician Assistant | Admitting: Physician Assistant

## 2015-05-19 DIAGNOSIS — M25561 Pain in right knee: Secondary | ICD-10-CM | POA: Insufficient documentation

## 2015-06-03 IMAGING — US ABDOMEN ULTRASOUND LIMITED
1 series · 14 of 25 positions shown · non-contrast
Comparison: None.

CLINICAL DATA: Intermittent right upper quadrant/epigastric pain

EXAM:
US ABDOMEN LIMITED - RIGHT UPPER QUADRANT

[Series 1: abdomen ultrasound limited · 0.18mm/px · 14 of 42 slices shown]
[im 1/42]
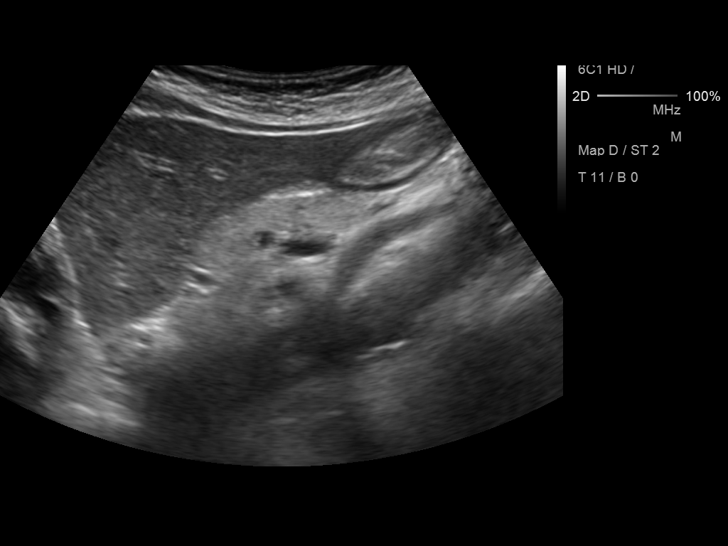
[im 4/42]
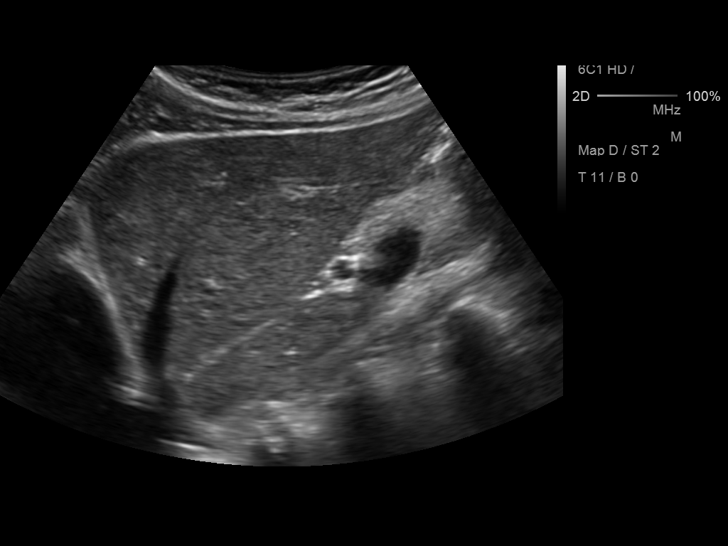
[im 7/42]
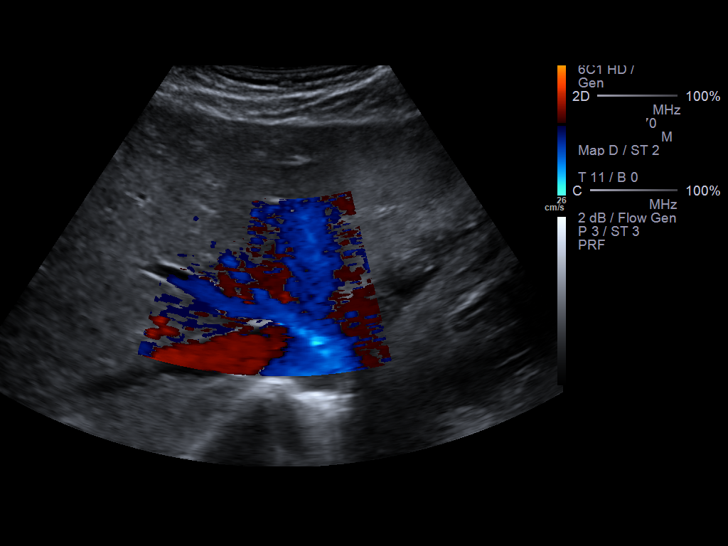
[im 11/42]
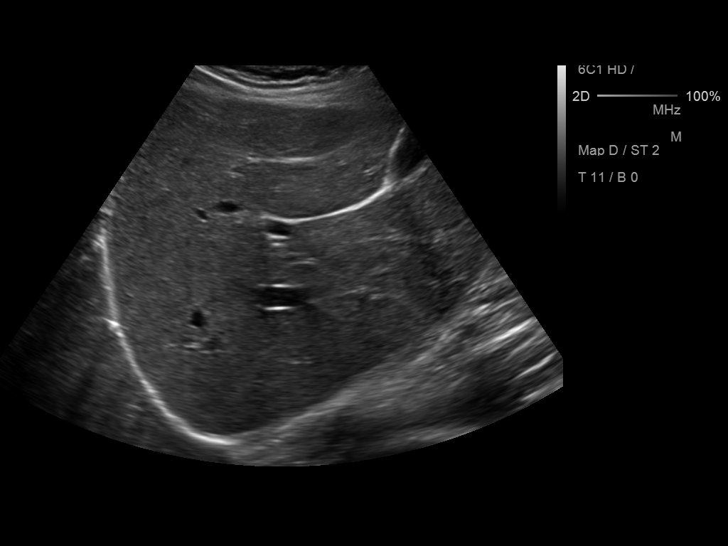
[im 14/42]
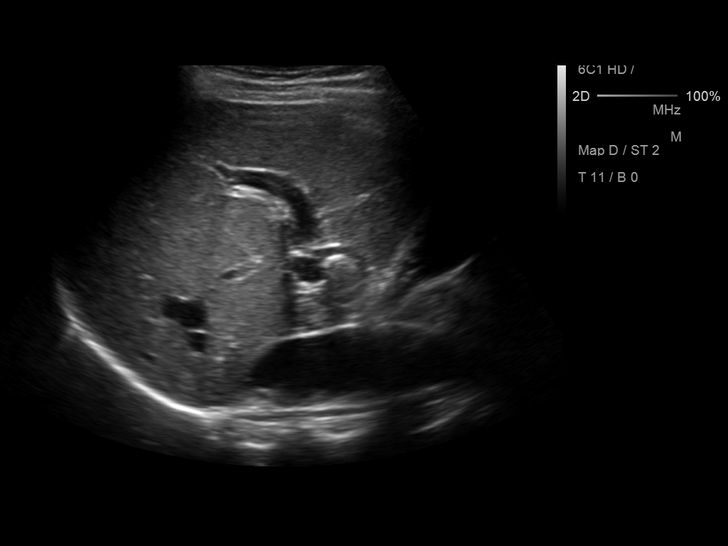
[im 16/42]
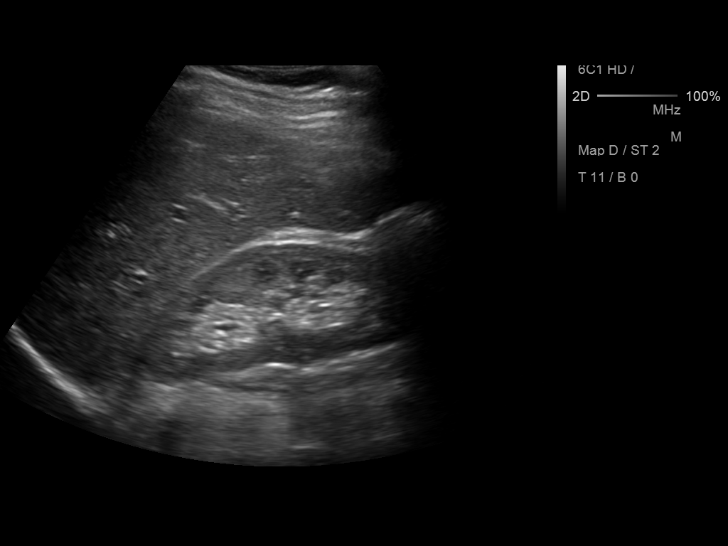
[im 19/42]
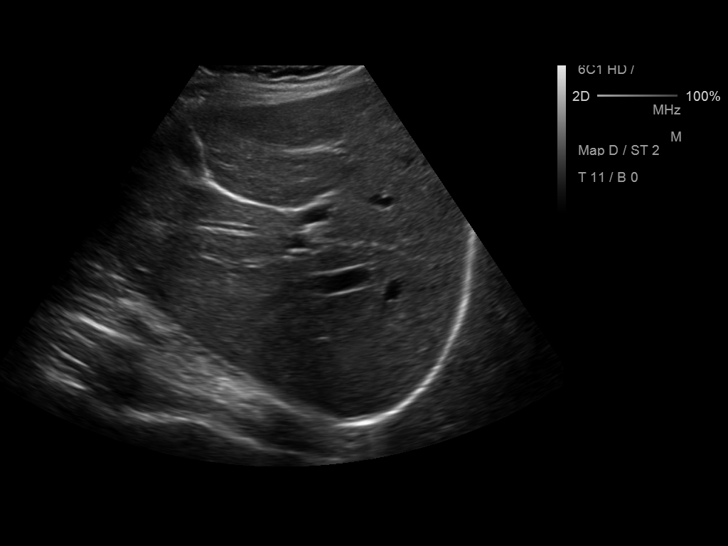
[im 23/42]
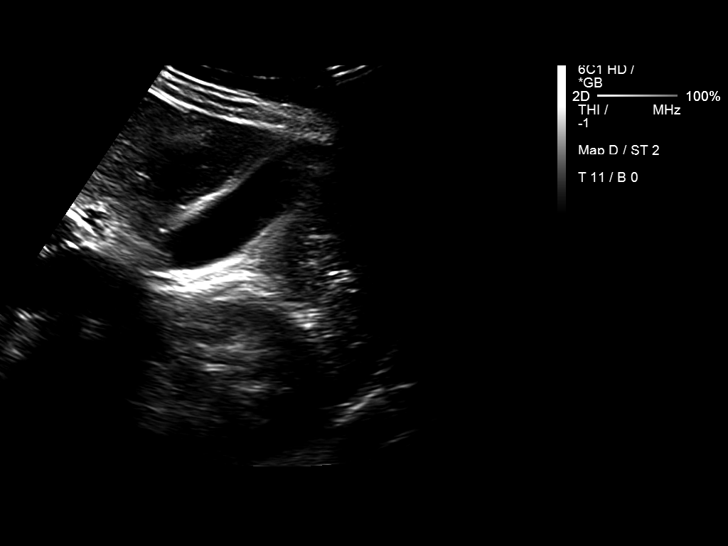
[im 26/42]
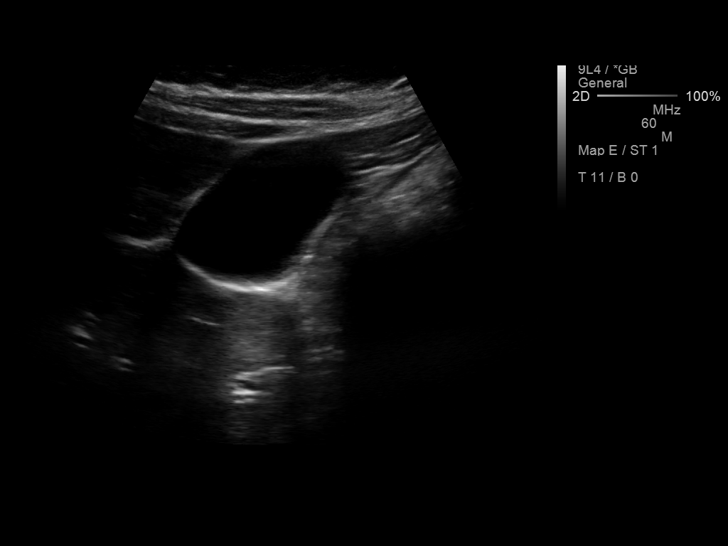
[im 28/42]
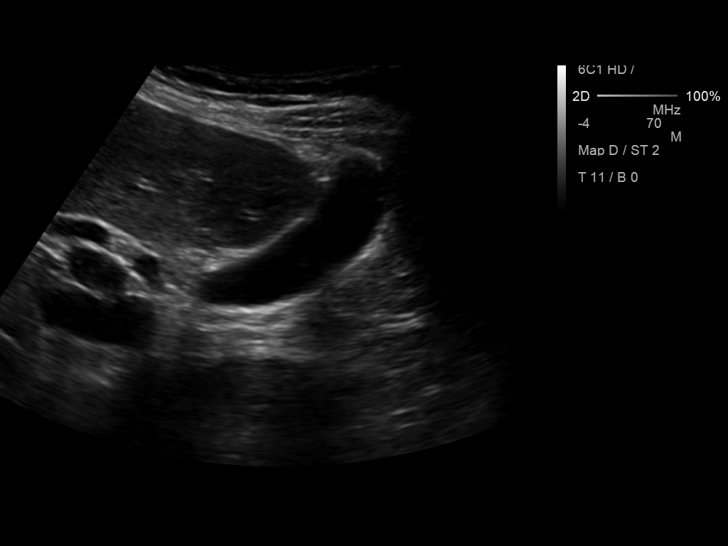
[im 31/42]
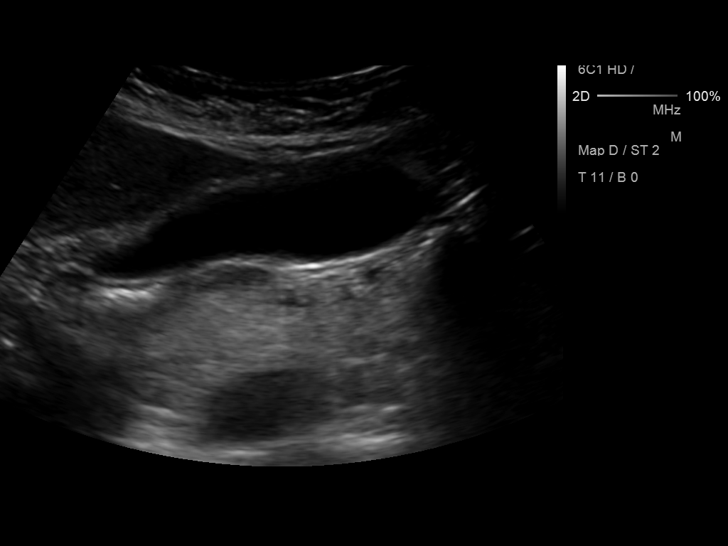
[im 35/42]
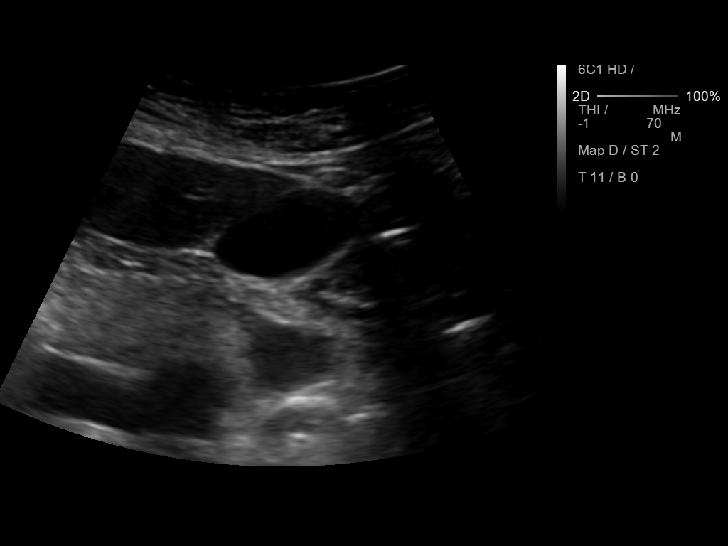
[im 38/42]
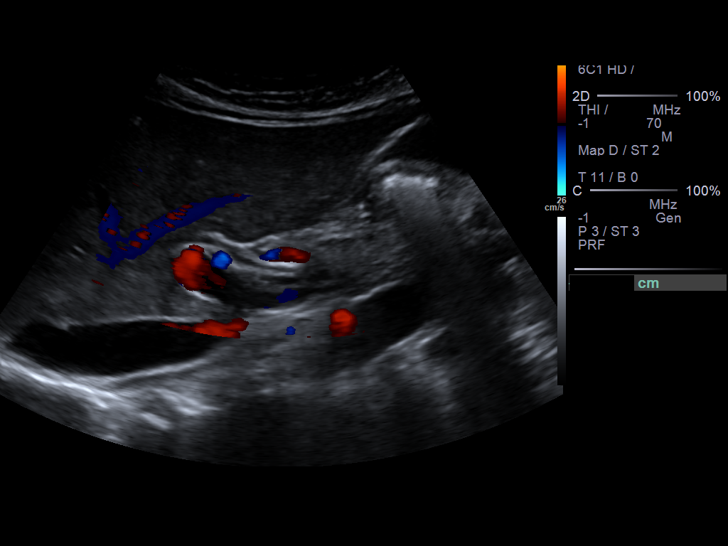
[im 42/42]
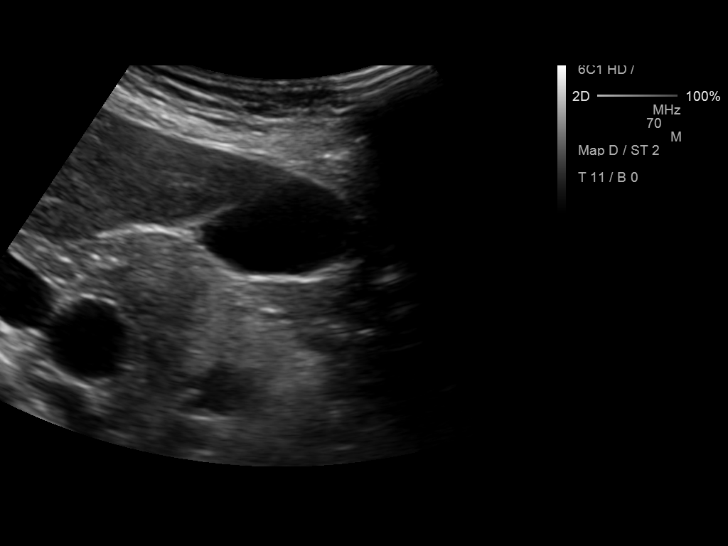

[14 of 25 positions shown; findings below may reference images not displayed]

FINDINGS: Gallbladder:

No gallstones, gallbladder wall thickening, or pericholecystic
fluid. Negative sonographic Murphy's sign.

Common bile duct:

Diameter: 2 mm.

Liver:

No focal lesion identified. Within normal limits in parenchymal
echogenicity.
IMPRESSION: Negative right upper quadrant ultrasound.

## 2016-01-03 NOTE — H&P (Signed)
Judith West is a 55 y.o. female here for fractional D+C  . PMB follow up . embx done with no endometrial tissue identified  U/S today shows Stripe to be 4.99 mm . 2 small fibroids and 2 simple left ovarian cysts  Past Medical History:  has a past medical history of Acid reflux; Breast CA (CMS-HCC); Cancer (CMS-HCC); GERD (gastroesophageal reflux disease); History of abdominal hernia; History of left breast cancer; Hypothyroidism, unspecified; Leucopenia (2/06); Stenosis of ureteropelvic junction (UPJ); Thyroid disease; and Vitamin D deficiency, unspecified.  Past Surgical History:  has a past surgical history that includes Augmentation mammaplasty; Hernia repair; mastectomy bilateral simple (Bilateral, 05/28/2013); biopsy/excision lymph node axillary (Left, 05/28/2013); reconstruction bilateral breast immediate post mastopexy w/implant (Bilateral, 05/28/2013); revision of reconstructed breast (Bilateral, 12/31/2013); reconstruction nipple &/or areola (Right, 12/31/2013); Endometrial ablation; Abdominal wall hernia repair (2005); Tubal ligation (Bilateral); colonoscopy (12/07/2011); egd (12/07/2011); revision of reconstructed breast (Bilateral, 05/13/2014); and Dilation and curettage of uterus. Family History: family history includes Breast cancer in her other; Colon cancer in her other; Diabetes mellitus in her brother; Hyperlipidemia in her other; Hypothyroidism in her other. Social History:  reports that she has quit smoking. Her smoking use included Cigarettes. She has a 0.75 pack-year smoking history. She has never used smokeless tobacco. She reports that she drinks about 3.0 oz of alcohol per week She reports that she does not use illicit drugs. OB/GYN History:  OB History    Gravida Para Term Preterm AB TAB SAB Ectopic Multiple Living   3 3 3       3       Allergies: is allergic to penicillin g. Medications:  Current Outpatient Prescriptions:  . aspirin 81 MG EC tablet, Take 81 mg by mouth  daily., Disp: , Rfl:  . b complex vitamins capsule, Take 1 capsule by mouth once daily., Disp: , Rfl:  . ferrous sulfate 325 (65 FE) MG EC tablet, Take 325 mg by mouth daily with breakfast., Disp: , Rfl:  . GLUCOSAMINE HCL/CHONDR SU A NA (OSTEO BI-FLEX ORAL), Take by mouth., Disp: , Rfl:  . L. ACIDOPHILUS/BIFID. ANIMALIS (ONE-A-DAY TRUBIOTICS ORAL), Take by mouth., Disp: , Rfl:  . levothyroxine (SYNTHROID, LEVOTHROID) 100 MCG tablet, TAKE 1 TAB BY MOUTH ONCE DAILY ON AN EMPTY STOMACH, WITH FULL GLASS OF WATER, 30-60 MINS BEFORE BF., Disp: 30 tablet, Rfl: 2 . MULTIVITAMIN ORAL, Take by mouth., Disp: , Rfl:  . omega-3 fatty acids-fish oil (FISH OIL) 360-1,200 mg Cap, Take by mouth., Disp: , Rfl:  . tamoxifen (NOLVADEX) 20 MG tablet, Take 1 tablet (20 mg total) by mouth once daily., Disp: 90 tablet, Rfl: 3 . venlafaxine (EFFEXOR-XR) 75 MG XR capsule, Take 1 capsule (75 mg total) by mouth once daily., Disp: 30 capsule, Rfl: 11 . cetirizine (ZYRTEC) 10 mg capsule, Take 1 capsule (10 mg total) by mouth once daily., Disp: 30 capsule, Rfl: 0  Review of Systems: General:   No fatigue or weight loss Eyes:   No vision changes Ears:   No hearing difficulty Respiratory:   No cough or shortness of breath Pulmonary:   No asthma or shortness of breath Cardiovascular:  No chest pain, palpitations, dyspnea on exertion Gastrointestinal:  No abdominal bloating, chronic diarrhea, constipations, masses, pain or hematochezia Genitourinary:  No hematuria, dysuria, abnormal vaginal discharge, pelvic pain, Menometrorrhagia Lymphatic:  No swollen lymph nodes Musculoskeletal: No muscle weakness Neurologic:  No extremity weakness, syncope, seizure disorder Psychiatric:  No history of depression, delusions or suicidal/homicidal ideation   Exam:  Vitals:   12/28/15 1442  BP: 136/88  Pulse: 68    Body mass index is 24.04 kg/(m^2).  WDWN white/ female in NAD  Lungs: CTA  CV : RRR without murmur   Pelvic : cx  Stenotic , no CMT  Adnexa: no mass   Impression:   The encounter diagnosis was PMB (postmenopausal bleeding). With inadequate sampling of utx , two times    Plan:   Fractional D+C in [facility]  Benefits and risks to surgery: The proposed benefit of the surgery has been discussed with the patient. The possible risks include, but are not limited to: organ injury to the bowel , bladder, ureters, and major blood vessels and nerves. There is a possibility of additional surgeries resulting from these injuries. There is also the risk of blood transfusion and the need to receive blood products during or after the procedure which may rarely lead to HIV or Hepatitis C infection. There is a risk of developing a deep venous thrombosis or a pulmonary embolism . There is the possibility of wound infection and also anesthetic complications, even the rare possibility of death. The patient understands these risks and wishes to proceed. All questions have been answered     Caroline Sauger, MD       Electronically signed by Caroline Sauger, MD at 12/28/2015 3:44 PM      Office Visit on 12/28/2015      Department  Name Address Phone Fax  [facility] [place] [id number] [phone]  Service Location  Name Address      [address] [place] [address]

## 2016-01-04 ENCOUNTER — Encounter
Admission: RE | Admit: 2016-01-04 | Discharge: 2016-01-04 | Disposition: A | Discharge status: Home / Self Care | Payer: 59 | Source: Ambulatory Visit | Attending: Obstetrics and Gynecology | Admitting: Obstetrics and Gynecology

## 2016-01-04 DIAGNOSIS — Z9851 Tubal ligation status: Secondary | ICD-10-CM | POA: Diagnosis not present

## 2016-01-04 DIAGNOSIS — N83202 Unspecified ovarian cyst, left side: Secondary | ICD-10-CM | POA: Diagnosis not present

## 2016-01-04 DIAGNOSIS — Z853 Personal history of malignant neoplasm of breast: Secondary | ICD-10-CM | POA: Diagnosis not present

## 2016-01-04 DIAGNOSIS — Z8349 Family history of other endocrine, nutritional and metabolic diseases: Secondary | ICD-10-CM | POA: Diagnosis not present

## 2016-01-04 DIAGNOSIS — E039 Hypothyroidism, unspecified: Secondary | ICD-10-CM | POA: Diagnosis not present

## 2016-01-04 DIAGNOSIS — Z79810 Long term (current) use of selective estrogen receptor modulators (SERMs): Secondary | ICD-10-CM | POA: Diagnosis not present

## 2016-01-04 DIAGNOSIS — Z87891 Personal history of nicotine dependence: Secondary | ICD-10-CM | POA: Diagnosis not present

## 2016-01-04 DIAGNOSIS — Z9013 Acquired absence of bilateral breasts and nipples: Secondary | ICD-10-CM | POA: Diagnosis not present

## 2016-01-04 DIAGNOSIS — Z803 Family history of malignant neoplasm of breast: Secondary | ICD-10-CM | POA: Diagnosis not present

## 2016-01-04 DIAGNOSIS — K219 Gastro-esophageal reflux disease without esophagitis: Secondary | ICD-10-CM | POA: Diagnosis not present

## 2016-01-04 DIAGNOSIS — N950 Postmenopausal bleeding: Secondary | ICD-10-CM | POA: Diagnosis present

## 2016-01-04 DIAGNOSIS — Z800 Family history of malignant neoplasm of digestive organs: Secondary | ICD-10-CM | POA: Diagnosis not present

## 2016-01-04 DIAGNOSIS — Z9889 Other specified postprocedural states: Secondary | ICD-10-CM | POA: Diagnosis not present

## 2016-01-04 DIAGNOSIS — Z833 Family history of diabetes mellitus: Secondary | ICD-10-CM | POA: Diagnosis not present

## 2016-01-04 DIAGNOSIS — Z79899 Other long term (current) drug therapy: Secondary | ICD-10-CM | POA: Diagnosis not present

## 2016-01-04 DIAGNOSIS — D259 Leiomyoma of uterus, unspecified: Secondary | ICD-10-CM | POA: Diagnosis not present

## 2016-01-04 LAB — BASIC METABOLIC PANEL
Anion gap: 6 (ref 5–15)
BUN: 10 mg/dL (ref 6–20)
CO2: 26 mmol/L (ref 22–32)
CREATININE: 0.58 mg/dL (ref 0.44–1.00)
Calcium: 9.1 mg/dL (ref 8.9–10.3)
Chloride: 107 mmol/L (ref 101–111)
GFR calc Af Amer: >60 mL/min (ref >60)
GLUCOSE: 103 mg/dL — AB (ref 65–99)
POTASSIUM: 3.8 mmol/L (ref 3.5–5.1)
Sodium: 139 mmol/L (ref 135–145)

## 2016-01-04 LAB — CBC
HCT: 39.6 % (ref 35.0–47.0)
Hemoglobin: 13.2 g/dL (ref 12.0–16.0)
MCH: 30 pg (ref 26.0–34.0)
MCHC: 33.3 g/dL (ref 32.0–36.0)
MCV: 90.3 fL (ref 80.0–100.0)
Platelets: 223 10*3/uL (ref 150–440)
RBC: 4.39 MIL/uL (ref 3.80–5.20)
RDW: 13.6 % (ref 11.5–14.5)
WBC: 5.5 10*3/uL (ref 3.6–11.0)

## 2016-01-04 LAB — TYPE AND SCREEN
ABO/RH(D): O POS
ANTIBODY SCREEN: NEGATIVE

## 2016-01-04 NOTE — Patient Instructions (Signed)
Your procedure is scheduled on: Friday 01/06/16 Report to Day Surgery. 2ND FLOOR MEDICAL MALL ENTRANCE To find out your arrival time please call [phone] between 1PM - 3PM on Thursday 01/05/16.  Remember: Instructions that are not followed completely may result in serious medical risk, up to and including death, or upon the discretion of your surgeon and anesthesiologist your surgery may need to be rescheduled.    __X__ 1. Do not eat food or drink liquids after midnight. No gum chewing or hard candies.     __X__ 2. No Alcohol for 24 hours before or after surgery.   ____ 3. Bring all medications with you on the day of surgery if instructed.    __X__ 4. Notify your doctor if there is any change in your medical condition     (cold, fever, infections).     Do not wear jewelry, make-up, hairpins, clips or nail polish.  Do not wear lotions, powders, or perfumes.   Do not shave 48 hours prior to surgery. Men may shave face and neck.  Do not bring valuables to the hospital.    [facility] is not responsible for any belongings or valuables.               Contacts, dentures or bridgework may not be worn into surgery.  Leave your suitcase in the car. After surgery it may be brought to your room.  For patients admitted to the hospital, discharge time is determined by your                treatment team.   Patients discharged the day of surgery will not be allowed to drive home.   Please read over the following fact sheets that you were given:   MRSA Information and Surgical Site Infection Prevention   __X__ Take these medicines the morning of surgery with A SIP OF WATER:    1. CETIRIZINE  2. LEVOTHYROXINE  3. VENLAFAXINE  4.  5.  6.  ____ Fleet Enema (as directed)   __X__ Use CHG Soap as directed  ____ Use inhalers on the day of surgery  ____ Stop metformin 2 days prior to surgery    ____ Take 1/2 of usual insulin dose the night before surgery and none on the morning of surgery.    ____ Stop Coumadin/Plavix/aspirin on ALREADY STOPPED  ____ Stop Anti-inflammatories on    __X__ Stop supplements until after surgery. OSTEO BIFLEX, B COMPLEX, FISH OIL   ____ Bring C-Pap to the hospital.

## 2016-01-06 ENCOUNTER — Ambulatory Visit: Payer: 59 | Admitting: Anesthesiology

## 2016-01-06 ENCOUNTER — Ambulatory Visit
Admission: RE | Admit: 2016-01-06 | Discharge: 2016-01-06 | Disposition: A | Discharge status: Home / Self Care | Payer: 59 | Source: Ambulatory Visit | Attending: Obstetrics and Gynecology | Admitting: Obstetrics and Gynecology

## 2016-01-06 ENCOUNTER — Encounter
Admission: RE | Disposition: A | Discharge status: Home / Self Care | Payer: Self-pay | Source: Ambulatory Visit | Attending: Obstetrics and Gynecology

## 2016-01-06 ENCOUNTER — Encounter: Payer: Self-pay | Admitting: *Deleted

## 2016-01-06 DIAGNOSIS — Z9889 Other specified postprocedural states: Secondary | ICD-10-CM | POA: Insufficient documentation

## 2016-01-06 DIAGNOSIS — Z833 Family history of diabetes mellitus: Secondary | ICD-10-CM | POA: Insufficient documentation

## 2016-01-06 DIAGNOSIS — N950 Postmenopausal bleeding: Secondary | ICD-10-CM | POA: Diagnosis not present

## 2016-01-06 DIAGNOSIS — Z87891 Personal history of nicotine dependence: Secondary | ICD-10-CM | POA: Insufficient documentation

## 2016-01-06 DIAGNOSIS — D259 Leiomyoma of uterus, unspecified: Secondary | ICD-10-CM | POA: Insufficient documentation

## 2016-01-06 DIAGNOSIS — Z800 Family history of malignant neoplasm of digestive organs: Secondary | ICD-10-CM | POA: Insufficient documentation

## 2016-01-06 DIAGNOSIS — E039 Hypothyroidism, unspecified: Secondary | ICD-10-CM | POA: Insufficient documentation

## 2016-01-06 DIAGNOSIS — Z9013 Acquired absence of bilateral breasts and nipples: Secondary | ICD-10-CM | POA: Insufficient documentation

## 2016-01-06 DIAGNOSIS — Z79899 Other long term (current) drug therapy: Secondary | ICD-10-CM | POA: Insufficient documentation

## 2016-01-06 DIAGNOSIS — Z853 Personal history of malignant neoplasm of breast: Secondary | ICD-10-CM | POA: Insufficient documentation

## 2016-01-06 DIAGNOSIS — N83202 Unspecified ovarian cyst, left side: Secondary | ICD-10-CM | POA: Insufficient documentation

## 2016-01-06 DIAGNOSIS — Z79810 Long term (current) use of selective estrogen receptor modulators (SERMs): Secondary | ICD-10-CM | POA: Insufficient documentation

## 2016-01-06 DIAGNOSIS — K219 Gastro-esophageal reflux disease without esophagitis: Secondary | ICD-10-CM | POA: Insufficient documentation

## 2016-01-06 DIAGNOSIS — Z9851 Tubal ligation status: Secondary | ICD-10-CM | POA: Insufficient documentation

## 2016-01-06 DIAGNOSIS — Z803 Family history of malignant neoplasm of breast: Secondary | ICD-10-CM | POA: Insufficient documentation

## 2016-01-06 DIAGNOSIS — Z8349 Family history of other endocrine, nutritional and metabolic diseases: Secondary | ICD-10-CM | POA: Insufficient documentation

## 2016-01-06 HISTORY — PX: DILATION AND CURETTAGE OF UTERUS: SHX78

## 2016-01-06 SURGERY — DILATION AND CURETTAGE
Anesthesia: General | Wound class: Clean Contaminated

## 2016-01-06 MED ORDER — PROPOFOL 10 MG/ML IV BOLUS
INTRAVENOUS | Status: DC | PRN
  Administered 2016-01-06: 130 mg via INTRAVENOUS

## 2016-01-06 MED ORDER — ONDANSETRON HCL 4 MG/2ML IJ SOLN
4.0000 mg | Freq: Once | INTRAMUSCULAR | Status: DC | PRN
Start: 2016-01-06 — End: 2016-01-06

## 2016-01-06 MED ORDER — LACTATED RINGERS IV SOLN
INTRAVENOUS | Status: DC
  Administered 2016-01-06: 09:00:00 via INTRAVENOUS

## 2016-01-06 MED ORDER — CEFOXITIN SODIUM-DEXTROSE 2-2.2 GM-% IV SOLR (PREMIX)
2.0000 g | INTRAVENOUS | Status: AC
  Administered 2016-01-06: 2000 mg via INTRAVENOUS

## 2016-01-06 MED ORDER — DEXAMETHASONE SODIUM PHOSPHATE 10 MG/ML IJ SOLN
INTRAMUSCULAR | Status: DC | PRN
  Administered 2016-01-06: 10 mg via INTRAVENOUS

## 2016-01-06 MED ORDER — LACTATED RINGERS IV SOLN: INTRAVENOUS | Status: DC

## 2016-01-06 MED ORDER — CITRIC ACID-SODIUM CITRATE 334-500 MG/5ML PO SOLN
30.0000 mL | ORAL | Status: DC
  Filled 2016-01-06: qty 30

## 2016-01-06 MED ORDER — MIDAZOLAM HCL 2 MG/2ML IJ SOLN
INTRAMUSCULAR | Status: DC | PRN
  Administered 2016-01-06: 2 mg via INTRAVENOUS

## 2016-01-06 MED ORDER — KETOROLAC TROMETHAMINE 30 MG/ML IJ SOLN
INTRAMUSCULAR | Status: DC | PRN
  Administered 2016-01-06: 30 mg via INTRAVENOUS

## 2016-01-06 MED ORDER — ONDANSETRON HCL 4 MG/2ML IJ SOLN
INTRAMUSCULAR | Status: DC | PRN
  Administered 2016-01-06: 4 mg via INTRAVENOUS

## 2016-01-06 MED ORDER — SILVER NITRATE-POT NITRATE 75-25 % EX MISC
CUTANEOUS | Status: AC
  Filled 2016-01-06: qty 1

## 2016-01-06 MED ORDER — LIDOCAINE HCL (CARDIAC) 20 MG/ML IV SOLN
INTRAVENOUS | Status: DC | PRN
  Administered 2016-01-06: 30 mg via INTRAVENOUS

## 2016-01-06 MED ORDER — FAMOTIDINE 20 MG PO TABS
ORAL_TABLET | ORAL | Status: AC
  Administered 2016-01-06: 20 mg via ORAL
  Filled 2016-01-06: qty 1

## 2016-01-06 MED ORDER — IBUPROFEN 600 MG PO TABS: 600.0000 mg | ORAL_TABLET | Freq: Once | ORAL | Status: DC

## 2016-01-06 MED ORDER — CEFOXITIN SODIUM-DEXTROSE 2-2.2 GM-% IV SOLR (PREMIX)
INTRAVENOUS | Status: AC
  Administered 2016-01-06: 2000 mg via INTRAVENOUS
  Filled 2016-01-06: qty 50

## 2016-01-06 MED ORDER — FAMOTIDINE 20 MG PO TABS
20.0000 mg | ORAL_TABLET | Freq: Once | ORAL | Status: AC
  Administered 2016-01-06: 20 mg via ORAL

## 2016-01-06 MED ORDER — FENTANYL CITRATE (PF) 100 MCG/2ML IJ SOLN: 25.0000 ug | INTRAMUSCULAR | Status: DC | PRN

## 2016-01-06 MED ORDER — SILVER NITRATE-POT NITRATE 75-25 % EX MISC
CUTANEOUS | Status: DC | PRN
  Administered 2016-01-06: 2

## 2016-01-06 SURGICAL SUPPLY — 14 items
CANISTER SUCT 3000ML (MISCELLANEOUS) ×2 IMPLANT
CATH ROBINSON RED A/P 16FR (CATHETERS) ×2 IMPLANT
GLOVE BIO SURGEON STRL SZ8 (GLOVE) ×4 IMPLANT
GOWN STRL REUS W/ TWL LRG LVL3 (GOWN DISPOSABLE) ×1 IMPLANT
GOWN STRL REUS W/ TWL XL LVL3 (GOWN DISPOSABLE) ×1 IMPLANT
GOWN STRL REUS W/TWL LRG LVL3 (GOWN DISPOSABLE) ×1
GOWN STRL REUS W/TWL XL LVL3 (GOWN DISPOSABLE) ×1
IV LACTATED RINGERS 1000ML (IV SOLUTION) ×2 IMPLANT
KIT RM TURNOVER CYSTO AR (KITS) ×2 IMPLANT
PACK DNC HYST (MISCELLANEOUS) ×2 IMPLANT
PAD OB MATERNITY 4.3X12.25 (PERSONAL CARE ITEMS) ×2 IMPLANT
PAD PREP 24X41 OB/GYN DISP (PERSONAL CARE ITEMS) ×2 IMPLANT
TOWEL OR 17X26 4PK STRL BLUE (TOWEL DISPOSABLE) ×2 IMPLANT
TUBING CONNECTING 10 (TUBING) IMPLANT

## 2016-01-06 NOTE — Transfer of Care (Signed)
Immediate Anesthesia Transfer of Care Note  Patient: Judith West  Procedure(s) Performed: Procedure(s): DILATATION AND CURETTAGE (N/A)  Patient Location: PACU  Anesthesia Type:General  Level of Consciousness: awake, patient cooperative and lethargic  Airway & Oxygen Therapy: Patient Spontanous Breathing  Post-op Assessment: Report given to RN  Post vital signs: Reviewed and stable  Last Vitals:  Filed Vitals:   01/06/16 0823 01/06/16 0957  BP: 122/73 112/71  Pulse: 61 53  Temp: 36.6 C 36.4 C  Resp: 16 12    Last Pain: There were no vitals filed for this visit.       Complications: No apparent anesthesia complications

## 2016-01-06 NOTE — Brief Op Note (Signed)
01/06/2016  9:47 AM  PATIENT:  Judith West  55 y.o. female  PRE-OPERATIVE DIAGNOSIS:  PMB, S/P ablation  POST-OPERATIVE DIAGNOSIS:  PMB, S/P ablation  PROCEDURE:  Procedure(s): DILATATION AND CURETTAGE (N/A)  SURGEON:  Surgeon(s) and Role:    Boykin Nearing, MD - Primary  PHYSICIAN ASSISTANT:   ASSISTANTS: none   ANESTHESIA:   general  EBL:  Total I/O In: -  Out: 50 [Urine:50]  BLOOD ADMINISTERED:none  DRAINS: none   LOCAL MEDICATIONS USED:  NONE  SPECIMEN:  Source of Specimen:  ecc, endometrial curettings  DISPOSITION OF SPECIMEN:  PATHOLOGY  COUNTS:  YES  TOURNIQUET:  * No tourniquets in log *  DICTATION: .Other Dictation: Dictation Number verbal  PLAN OF CARE: Discharge to home after PACU  PATIENT DISPOSITION:  PACU - hemodynamically stable.   Delay start of Pharmacological VTE agent (>24hrs) due to surgical blood loss or risk of bleeding: not applicable

## 2016-01-06 NOTE — Anesthesia Preprocedure Evaluation (Signed)
Anesthesia Evaluation  Patient identified by MRN, date of birth, ID band Patient awake    Reviewed: Allergy & Precautions, H&P , NPO status , Patient's Chart, lab work & pertinent test results, reviewed documented beta blocker date and time   Airway Mallampati: II  TM Distance: >3 FB Neck ROM: full    Dental  (+) Teeth Intact   Pulmonary neg pulmonary ROS,    Pulmonary exam normal        Cardiovascular Exercise Tolerance: Good negative cardio ROS Normal cardiovascular exam Rhythm:regular Rate:Normal     Neuro/Psych negative neurological ROS  negative psych ROS   GI/Hepatic negative GI ROS, Neg liver ROS,   Endo/Other  negative endocrine ROS  Renal/GU negative Renal ROS  negative genitourinary   Musculoskeletal   Abdominal   Peds  Hematology negative hematology ROS (+)   Anesthesia Other Findings   Reproductive/Obstetrics negative OB ROS                             Anesthesia Physical Anesthesia Plan  ASA: II  Anesthesia Plan: General LMA   Post-op Pain Management:    Induction:   Airway Management Planned:   Additional Equipment:   Intra-op Plan:   Post-operative Plan:   Informed Consent: I have reviewed the patients History and Physical, chart, labs and discussed the procedure including the risks, benefits and alternatives for the proposed anesthesia with the patient or authorized representative who has indicated his/her understanding and acceptance.     Plan Discussed with: CRNA  Anesthesia Plan Comments:         Anesthesia Quick Evaluation

## 2016-01-06 NOTE — Progress Notes (Signed)
Pt ready for Fractional D+C . All questions answered

## 2016-01-06 NOTE — Discharge Instructions (Signed)
AMBULATORY SURGERY  DISCHARGE INSTRUCTIONS   1) The drugs that you were given will stay in your system until tomorrow so for the next 24 hours you should not:  A) Drive an automobile B) Make any legal decisions C) Drink any alcoholic beverage   2) You may resume regular meals tomorrow.  Today it is better to start with liquids and gradually work up to solid foods.  You may eat anything you prefer, but it is better to start with liquids, then soup and crackers, and gradually work up to solid foods.   3) Please notify your doctor immediately if you have any unusual bleeding, trouble breathing, redness and pain at the surgery site, drainage, fever, or pain not relieved by medication.    4) Additional Instructions:        Please contact your physician with any problems or Same Day Surgery at [phone], Monday through Friday 6 am to 4 pm, or [place] at [facility] number at [phone].  Dilation and Curettage, Care After Refer to this sheet in the next few weeks. These instructions provide you with information on caring for yourself after your procedure. Your health care provider may also give you more specific instructions. Your treatment has been planned according to current medical practices, but problems sometimes occur. Call your health care provider if you have any problems or questions after your procedure. WHAT TO EXPECT AFTER THE PROCEDURE After your procedure, it is typical to have light cramping and bleeding. This may last for 2 days to 2 weeks after the procedure. HOME CARE INSTRUCTIONS   Do not drive for 24 hours.  Wait 1 week before returning to strenuous activities.  Take your temperature 2 times a day for 4 days and write it down. Provide these temperatures to your health care provider if you develop a fever.  Avoid long periods of standing.  Avoid heavy lifting, pushing, or pulling. Do not lift anything heavier than 10 pounds (4.5 kg).  Limit stair  climbing to once or twice a day.  Take rest periods often.  You may resume your usual diet.  Drink enough fluids to keep your urine clear or pale yellow.  Your usual bowel function should return. If you have constipation, you may:  Take a mild laxative with permission from your health care provider.  Add fruit and bran to your diet.  Drink more fluids.  Take showers instead of baths until your health care provider gives you permission to take baths.  Do not go swimming or use a hot tub until your health care provider approves.  Try to have someone with you or available to you the first 24-48 hours, especially if you were given a general anesthetic.  Do not douche, use tampons, or have sex (intercourse) for 2 weeks after the procedure.  Only take over-the-counter or prescription medicines as directed by your health care provider. Do not take aspirin. It can cause bleeding.  Follow up with your health care provider as directed. SEEK MEDICAL CARE IF:   You have increasing cramps or pain that is not relieved with medicine.  You have abdominal pain that does not seem to be related to the same area of earlier cramping and pain.  You have bad smelling vaginal discharge.  You have a rash.  You are having problems with any medicine. SEEK IMMEDIATE MEDICAL CARE IF:   You have bleeding that is heavier than a normal menstrual period.  You have a fever.  You have  chest pain.  You have shortness of breath.  You feel dizzy or feel like fainting.  You pass out.  You have pain in your shoulder strap area.  You have heavy vaginal bleeding with or without blood clots. MAKE SURE YOU:   Understand these instructions.  Will watch your condition.  Will get help right away if you are not doing well or get worse.   This information is not intended to replace advice given to you by your health care provider. Make sure you discuss any questions you have with your health care  provider.   Document Released: 08/10/2000 Document Revised: 08/18/2013 Document Reviewed: 03/12/2013 Elsevier Interactive Patient Education [employer].

## 2016-01-06 NOTE — Anesthesia Postprocedure Evaluation (Signed)
Anesthesia Post Note  Patient: Judith West  Procedure(s) Performed: Procedure(s) (LRB): DILATATION AND CURETTAGE (N/A)  Patient location during evaluation: PACU Anesthesia Type: General Level of consciousness: awake and alert Pain management: pain level controlled Vital Signs Assessment: post-procedure vital signs reviewed and stable Respiratory status: spontaneous breathing, nonlabored ventilation, respiratory function stable and patient connected to nasal cannula oxygen Cardiovascular status: blood pressure returned to baseline and stable Postop Assessment: no signs of nausea or vomiting Anesthetic complications: no    Last Vitals:  Filed Vitals:   01/06/16 1015 01/06/16 1028  BP: 112/72 114/74  Pulse: 72 51  Temp:    Resp: 20 13    Last Pain: There were no vitals filed for this visit.               Molli Barrows

## 2016-01-06 NOTE — Anesthesia Procedure Notes (Signed)
Procedure Name: LMA Insertion Date/Time: 01/06/2016 9:27 AM Performed by: Jonna Clark Pre-anesthesia Checklist: Patient identified, Patient being monitored, Timeout performed, Emergency Drugs available and Suction available Patient Re-evaluated:Patient Re-evaluated prior to inductionOxygen Delivery Method: Circle system utilized Preoxygenation: Pre-oxygenation with 100% oxygen Intubation Type: IV induction Ventilation: Mask ventilation without difficulty LMA: LMA inserted LMA Size: 3.5 Tube type: Oral Number of attempts: 1 Placement Confirmation: positive ETCO2 and breath sounds checked- equal and bilateral Tube secured with: Tape Dental Injury: Teeth and Oropharynx as per pre-operative assessment

## 2016-01-09 LAB — SURGICAL PATHOLOGY

## 2016-01-09 NOTE — Op Note (Signed)
NAME:  REDONNA, SCHATZ NO.:  MEDICAL RECORD NO.:  [id number]  LOCATION:                                 FACILITY:  PHYSICIAN:  Laverta Baltimore, MDDATE OF BIRTH:  [birth year]  DATE OF PROCEDURE: DATE OF DISCHARGE:                              OPERATIVE REPORT   PREOPERATIVE DIAGNOSIS:  Postmenopausal bleeding, inability to sample endometrial lining.  POSTOPERATIVE DIAGNOSIS:  Postmenopausal bleeding, inability to sample endometrial lining.  PROCEDURE PERFORMED:  Fractional dilation and curettage.  SURGEON:  Laverta Baltimore, MD  ANESTHESIA:  General endotracheal anesthesia.  SURGEON:  Laverta Baltimore, MD.  INDICATION:  A 55 year old, gravida 3, para 3 patient with postmenopausal bleeding and underwent endometrial sampling twice in the office which failed to demonstrate any endometrial tissue.  Patient is status post endometrial ablation.  The patient is on tamoxifen.  DESCRIPTION OF PROCEDURE:  After adequate general endotracheal anesthesia, patient was placed in dorsal supine position.  Legs placed in the candy-cane stirrups.  The lower abdomen, perineum, and vagina was prepped and draped in normal sterile fashion.  The patient did receive 2 g IV cefoxitin prior to commencement of the case.  Examination under anesthesia revealed 8 weeks irregularly-shaped uterus consistent with fibroids deviated to the right.  Weighted speculum was placed in the posterior vaginal vault after a time-out was performed and bladder was catheterized yielding 30 mL clear urine.  A single-tooth tenaculum was placed on the anterior cervix and the endocervical curettage was performed.  Uterus was then sounded and sounded to 8 cm and the cervix was then dilated to #19 Hanks dilator without difficulty again. Endometrial cavity deviated slightly to the right.  Endometrial curettage was then performed with good uterine cry throughout adequate tissue noted.  Good  hemostasis was noted and single-tooth tenaculum was removed and silver nitrate was applied to the tenaculum sites.  There were no complications.  ESTIMATED BLOOD LOSS:  Minimal.  INTRAOPERATIVE FLUIDS:  300 mL.  URINE OUTPUT:  30 mL.  Patient was taken to recovery room in good condition.          ______________________________ Laverta Baltimore, MD     TS/MEDQ  D:  01/06/2016  T:  01/07/2016  Job:  [id number]

## 2016-02-09 NOTE — H&P (Deleted)
Patient ID: Judith West is a 55 y.o. female presenting with Menorrhagia  on 02/06/2016  HPI: New patient presenting for evaluation of very heavy menstrual bleeding. Soaking through clothes, pads and tampons, limiting work. +pelvic pressure, urinary frequency. Very occasionally pain with intercourse.  Known hx of fibroids with u/s showing 8.3cm fibroid in the past.  Workup: Pap smear 09/2015 neg (no HPV) EMBx; 12/2015 neg for malignancy  Gyn Hx; Menarche- age 82yo  OB Hx; NSVD [id number]- largest baby 9#  Past Medical History:  has a past medical history of Allergic state; Asthmatic bronchitis, unspecified; B12 deficiency; GERD (gastroesophageal reflux disease); History of depression; History of migraine headaches; History of thrombocytopenia; Intermittent tremor; Microscopic hematuria; and Traumatic amputation of toe (CMS-HCC).  Past Surgical History:  has a past surgical history that includes Knee arthroscopy (Left); colonoscopy (07/16/2009); egd (12/07/2004); and egd (07/16/2009). Family History: family history includes Leukemia in her brother. There is no history of Asthma or COPD. Social History:  reports that she has never smoked. She has never used smokeless tobacco. She reports that she drinks alcohol. She reports that she does not use illicit drugs. OB/GYN History:  OB History    Gravida Para Term Preterm AB TAB SAB Ectopic Multiple Living   4 3 3  1  1   3       Allergies: has No Known Allergies. Medications:  Current Outpatient Prescriptions:  .  albuterol 90 mcg/actuation inhaler, Inhale 2 puffs using inhaler every four to six hours as needed [for SOB/wheeze.], Disp: , Rfl:  .  cholecalciferol (CHOLECALCIFEROL) 1,000 unit tablet, Take 1,000 Units by mouth once daily., Disp: , Rfl:  .  cyanocobalamin (VITAMIN B12) 1,000 mcg/mL injection, INJECT 1ML INTRAMUSCULARLY ONCE A MONTH, Disp: 1 mL, Rfl: 11 .  fluticasone-salmeterol (ADVAIR DISKUS) 250-50 mcg/dose  diskus inhaler, Inhale 1 puff  twice a day  [rinse mouth after use], Disp: , Rfl:  .  multivitamin (MULTIVITAMIN) tablet, Take 1 tablet by mouth once daily., Disp: , Rfl:    Review of Systems: No SOB, no palpitations or chest pain, no new lower extremity edema, no nausea or vomiting or bowel or bladder complaints. See HPI for gyn specific ROS.   Exam:     BP 111/78  Pulse 84  Wt 70.8 kg (156 lb)  LMP 01/25/2016 (Exact Date)  BMI 23.72 kg/m2  General: Patient is well-groomed, well-nourished, appears stated age in no acute distress  HEENT: head is atraumatic and normocephalic, trachea is midline, neck is supple with no palpable nodules  CV: Regular rhythm and normal heart rate, no murmur  Pulm: Clear to auscultation throughout lung fields with no wheezing, crackles, or rhonchi. No increased work of breathing  Abdomen: soft , no mass, non-tender, no rebound tenderness, no hepatomegaly  Pelvic: tanner stage 5 ,                        External genitalia: vulva /labia no lesions                       Urethra: no prolapse                       Vagina: normal physiologic d/c, laxity in vaginal walls                       Cervix: no lesions, no cervical motion tenderness, good descent  Uterus: enlarged- 14 wk sized uterus with anterior bulging mass, irregular shape and contour, non-tender. Very mobile. Plenty of vaginal room.                        Adnexa: no mass,  non-tender                         Rectovaginal: External wnl   Impression:   The primary encounter diagnosis was Abnormal uterine bleeding (AUB), unspecified. A diagnosis of Uterine leiomyoma, unspecified location was also pertinent to this visit.    Plan:    Patient presents for a preoperative discussion regarding her plans to proceed with surgical treatment of her AUB by total laparoscopic hysterectomy with bilateral salpingectomy procedure. We will perform a cystoscopy to  evaluate the urinary tract after the procedure. We discussed all routes of hysterectomy, and I believe that a TVH is less likely to be successful because of the size of the fibroid. We discussed lupron to shrink, that menopause is likely to improve her sx and should be soon, why I don't recommend power morcellation or myomectomy, and that removing the cervix is not related to worsening outcomes.   Will order TVUS for surgical planning.   The patient and I discussed the technical aspects of the procedure including the potential for risks and complications. These include but are not limited to the risk of infection requiring post-operative antibiotics or further procedures. We talked about the risk of injury to adjacent organs including bladder, bowel, ureter, blood vessels or nerves. We talked about the need to convert to an open incision. We talked about the possible need for blood transfusion. We talked aboutpostop complications such asthromboembolic or cardiopulmonary complications. All of her questions were answered.  Her preoperative exam was completed and the appropriate consents were signed. She is scheduled to undergo this procedure in the near future.  Specific Peri-operative Considerations:  - Consent: obtained today - Health Maintenance: completed - Labs: CBC, CMP preoperatively - Studies: EKG, CXR preoperatively - Bowel Preparation: None required - Abx:  Cefoxitin 2g - VTE ppx: SCDs perioperatively - Glucose Protocol: n/a - Beta-blockade: n/a

## 2016-02-13 NOTE — H&P (Addendum)
Ms. Meland is a 55 y.o. female here  TVH and BSO and posterior repair . Marland Kitchenrecent fractional d+c for PMB with benign results the patient c/o tissue falling from her vagina. She has a grade 3 rectocele. She is s/p bilateral simple mastectomy for breast cancer and is on Tamoxifen    .  states that she has noticed a bulging especially when she is is sitting on the toilet . + splinting in the past . Feels like she cant empty rectum totally .  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  Past Surgical History:  has a past surgical history that includes Augmentation mammaplasty; Hernia repair; mastectomy bilateral simple (Bilateral, 05/28/2013); biopsy/excision lymph node axillary (Left, 05/28/2013); reconstruction bilateral breast immediate post mastopexy w/implant (Bilateral, 05/28/2013); revision of reconstructed breast (Bilateral, 12/31/2013); reconstruction nipple &/or areola (Right, 12/31/2013); Endometrial ablation; Abdominal wall hernia repair  (2005); Tubal ligation (Bilateral); colonoscopy (12/07/2011); egd (12/07/2011); revision of reconstructed breast (Bilateral, 05/13/2014); Dilation and curettage of uterus; and Fx D&C (12/07/2015). Family History: family history includes Breast cancer in her other; Colon cancer in her other; Diabetes mellitus in her brother; Hyperlipidemia in her other; Hypothyroidism in her other. Social History:  reports that she has quit smoking. Her smoking use included Cigarettes. She has a 0.75 pack-year smoking history. She has never used smokeless tobacco. She reports that she drinks about 3.0 oz of alcohol per week  She reports that she does not use illicit  drugs. OB/GYN History:  OB History    Gravida Para Term Preterm AB TAB SAB Ectopic Multiple Living   3 3 3       3       Allergies: is allergic to penicillin g. Medications:  Current Outpatient Prescriptions:  .  aspirin 81 MG EC tablet, Take 81 mg by mouth daily., Disp: , Rfl:  .  cetirizine (ZYRTEC) 10 mg capsule, Take 1 capsule (10 mg total) by mouth once daily., Disp: 30 capsule, Rfl: 0 .  ferrous sulfate 325 (65 FE) MG EC tablet, Take 325 mg by mouth daily with breakfast., Disp: , Rfl:  .  GLUCOSAMINE HCL/CHONDR SU A NA (OSTEO BI-FLEX ORAL), Take by mouth., Disp: , Rfl:  .  L. ACIDOPHILUS/BIFID. ANIMALIS (ONE-A-DAY TRUBIOTICS ORAL), Take by mouth., Disp: , Rfl:  .  levothyroxine (SYNTHROID, LEVOTHROID) 100 MCG tablet, TAKE 1 TAB BY MOUTH ONCE DAILY ON AN EMPTY STOMACH, WITH FULL GLASS OF WATER, 30-60 MINS BEFORE BF., Disp: 30 tablet, Rfl: 2 .  MULTIVITAMIN ORAL, Take by mouth., Disp: , Rfl:  .  omega-3 fatty acids-fish oil (FISH OIL) 360-1,200 mg Cap, Take by mouth., Disp: , Rfl:  .  tamoxifen (NOLVADEX) 20 MG tablet, Take 1 tablet (20 mg total) by mouth once daily., Disp: 90 tablet, Rfl: 3 .  venlafaxine (EFFEXOR-XR) 75 MG XR capsule, Take 1 capsule (75 mg total) by mouth once daily., Disp: 30 capsule, Rfl: 11 .  b complex vitamins capsule, Take 1 capsule by mouth once daily., Disp: , Rfl:  .  progesterone (PROMETRIUM) 200 MG capsule, Take 1 capsule (200 mg total) by mouth nightly., Disp: 15 capsule, Rfl: 0  Review of Systems: General:  No fatigue or weight loss Eyes:                                                         No vision changes Ears:                                                          No hearing difficulty Respiratory:                No cough or shortness of breath Pulmonary:                                      No asthma or shortness of breath Cardiovascular:                     No chest pain, palpitations,  dyspnea on exertion Gastrointestinal:                    No abdominal bloating, chronic diarrhea, constipations, masses, pain or hematochezia Genitourinary:                                 No hematuria, dysuria, abnormal vaginal discharge, pelvic pain, Menometrorrhagia Lymphatic:                                       No swollen lymph nodes Musculoskeletal:                   No muscle weakness Neurologic:                                      No extremity weakness, syncope, seizure disorder Psychiatric:                                      No history of depression, delusions or suicidal/homicidal ideation    Exam:      Vitals:   01/24/16 1346  BP: 119/81  Pulse: 77    Body mass index is 24.4 kg/(m^2).  WDWN white/ female in NAD   Lungs: CTA  CV : RRR without murmur   Neck:  no thyromegaly Abdomen: soft , no mass, normal active bowel sounds,  non-tender, no rebound tenderness Pelvic: tanner stage 5 ,  External genitalia: vulva /labia no lesions Urethra: no prolapse Vagina: string dark blood . Second degree uterine descensus . 2-3 degree rectocele with valsalva. 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 A diagnosis of Uterus descensus was also pertinent to this visit. symptomatic   Plan:  Given  symptoms I offered the pt a TVH and BSO and posterior colporrhaphy. I explained how the surgery is done  Pt agrees to the surgery .  BSO given her history of Breast cancer.       Caroline Sauger, MD       Instructions

## 2016-02-14 ENCOUNTER — Encounter
Admission: RE | Admit: 2016-02-14 | Discharge: 2016-02-14 | Disposition: A | Discharge status: Home / Self Care | Payer: 59 | Source: Ambulatory Visit | Attending: Obstetrics and Gynecology | Admitting: Obstetrics and Gynecology

## 2016-02-14 DIAGNOSIS — Z79899 Other long term (current) drug therapy: Secondary | ICD-10-CM | POA: Diagnosis not present

## 2016-02-14 DIAGNOSIS — N938 Other specified abnormal uterine and vaginal bleeding: Secondary | ICD-10-CM | POA: Insufficient documentation

## 2016-02-14 HISTORY — DX: Anemia, unspecified: D64.9

## 2016-02-14 LAB — BASIC METABOLIC PANEL
Anion gap: 6 (ref 5–15)
BUN: 14 mg/dL (ref 6–20)
CALCIUM: 9.4 mg/dL (ref 8.9–10.3)
CHLORIDE: 107 mmol/L (ref 101–111)
CO2: 25 mmol/L (ref 22–32)
CREATININE: 0.67 mg/dL (ref 0.44–1.00)
GFR calc Af Amer: >60 mL/min (ref >60)
GFR calc non Af Amer: >60 mL/min (ref >60)
GLUCOSE: 95 mg/dL (ref 65–99)
Potassium: 3.7 mmol/L (ref 3.5–5.1)
Sodium: 138 mmol/L (ref 135–145)

## 2016-02-14 LAB — CBC
HCT: 39.3 % (ref 35.0–47.0)
Hemoglobin: 13.2 g/dL (ref 12.0–16.0)
MCH: 30.1 pg (ref 26.0–34.0)
MCHC: 33.6 g/dL (ref 32.0–36.0)
MCV: 89.5 fL (ref 80.0–100.0)
PLATELETS: 205 10*3/uL (ref 150–440)
RBC: 4.39 MIL/uL (ref 3.80–5.20)
RDW: 13.6 % (ref 11.5–14.5)
WBC: 6.6 10*3/uL (ref 3.6–11.0)

## 2016-02-14 LAB — TYPE AND SCREEN
ABO/RH(D): O POS
Antibody Screen: NEGATIVE

## 2016-02-14 NOTE — Patient Instructions (Signed)
  Your procedure is scheduled on: 02/20/16 Report to Day Surgery. [facility]OND [facility]LOOR To find out your arrival time please call [phone] between 1PM - 3PM on 02/17/16  Remember: Instructions that are not followed completely may result in serious medical risk, up to and including death, or upon the discretion of your surgeon and anesthesiologist your surgery may need to be rescheduled.    __X__ 1. Do not eat food or drink liquids after midnight. No gum chewing or hard candies.     __X__ 2. No Alcohol for 24 hours before or after surgery.   __X__ 3. Do Not Smoke For 24 Hours Prior to Your Surgery.   ____ 4. Bring all medications with you on the day of surgery if instructed.    __X__ 5. Notify your doctor if there is any change in your medical condition     (cold, fever, infections).       Do not wear jewelry, make-up, hairpins, clips or nail polish.  Do not wear lotions, powders, or perfumes. You may wear deodorant.  Do not shave 48 hours prior to surgery. Men may shave face and neck.  Do not bring valuables to the hospital.    [facility] is not responsible for any belongings or valuables.               Contacts, dentures or bridgework may not be worn into surgery.  Leave your suitcase in the car. After surgery it may be brought to your room.  For patients admitted to the hospital, discharge time is determined by your                treatment team.   Patients discharged the day of surgery will not be allowed to drive home.   Please read over the following fact sheets that you were given:   Surgical Site Infection Prevention   __X__ Take these medicines the morning of surgery with A SIP OF WATER:    1.LEVOTHYROXINE  2. VENLAFAXINE  3.   4.  5.  6.  ____ Fleet Enema (as directed)   __X__ Use CHG Soap as directed  ____ Use inhalers on the day of surgery  ____ Stop metformin 2 days prior to surgery    ____ Take 1/2 of usual insulin dose the night before surgery  and none on the morning of surgery.   __X__ Stop Coumadin/Plavix/aspirin on                 STOPPED ASPIRIN  ____ Stop Anti-inflammatories on    _X__ Stop supplements until after surgery.      STOPPED FISH OIL  ____ Bring C-Pap to the hospital.    [place]

## 2016-02-20 ENCOUNTER — Encounter: Payer: Self-pay | Admitting: Anesthesiology

## 2016-02-20 ENCOUNTER — Observation Stay
Admission: RE | Admit: 2016-02-20 | Discharge: 2016-02-21 | Disposition: A | Discharge status: Home / Self Care | Payer: 59 | Source: Ambulatory Visit | Attending: Obstetrics and Gynecology | Admitting: Obstetrics and Gynecology

## 2016-02-20 ENCOUNTER — Encounter
Admission: RE | Disposition: A | Discharge status: Home / Self Care | Payer: Self-pay | Source: Ambulatory Visit | Attending: Obstetrics and Gynecology

## 2016-02-20 ENCOUNTER — Ambulatory Visit: Payer: 59 | Admitting: Anesthesiology

## 2016-02-20 DIAGNOSIS — N8311 Corpus luteum cyst of right ovary: Secondary | ICD-10-CM | POA: Insufficient documentation

## 2016-02-20 DIAGNOSIS — N950 Postmenopausal bleeding: Secondary | ICD-10-CM | POA: Diagnosis present

## 2016-02-20 DIAGNOSIS — Z9889 Other specified postprocedural states: Secondary | ICD-10-CM | POA: Diagnosis not present

## 2016-02-20 DIAGNOSIS — D271 Benign neoplasm of left ovary: Secondary | ICD-10-CM | POA: Diagnosis not present

## 2016-02-20 DIAGNOSIS — Z79810 Long term (current) use of selective estrogen receptor modulators (SERMs): Secondary | ICD-10-CM | POA: Diagnosis not present

## 2016-02-20 DIAGNOSIS — E039 Hypothyroidism, unspecified: Secondary | ICD-10-CM | POA: Insufficient documentation

## 2016-02-20 DIAGNOSIS — Z833 Family history of diabetes mellitus: Secondary | ICD-10-CM | POA: Diagnosis not present

## 2016-02-20 DIAGNOSIS — Z9013 Acquired absence of bilateral breasts and nipples: Secondary | ICD-10-CM | POA: Insufficient documentation

## 2016-02-20 DIAGNOSIS — Z800 Family history of malignant neoplasm of digestive organs: Secondary | ICD-10-CM | POA: Diagnosis not present

## 2016-02-20 DIAGNOSIS — Z79899 Other long term (current) drug therapy: Secondary | ICD-10-CM | POA: Diagnosis not present

## 2016-02-20 DIAGNOSIS — Z853 Personal history of malignant neoplasm of breast: Secondary | ICD-10-CM | POA: Insufficient documentation

## 2016-02-20 DIAGNOSIS — Z87891 Personal history of nicotine dependence: Secondary | ICD-10-CM | POA: Insufficient documentation

## 2016-02-20 DIAGNOSIS — N816 Rectocele: Secondary | ICD-10-CM | POA: Insufficient documentation

## 2016-02-20 DIAGNOSIS — Z7982 Long term (current) use of aspirin: Secondary | ICD-10-CM | POA: Insufficient documentation

## 2016-02-20 DIAGNOSIS — K219 Gastro-esophageal reflux disease without esophagitis: Secondary | ICD-10-CM | POA: Diagnosis not present

## 2016-02-20 DIAGNOSIS — Z880 Allergy status to penicillin: Secondary | ICD-10-CM | POA: Insufficient documentation

## 2016-02-20 DIAGNOSIS — Z803 Family history of malignant neoplasm of breast: Secondary | ICD-10-CM | POA: Insufficient documentation

## 2016-02-20 DIAGNOSIS — Z8349 Family history of other endocrine, nutritional and metabolic diseases: Secondary | ICD-10-CM | POA: Insufficient documentation

## 2016-02-20 DIAGNOSIS — D649 Anemia, unspecified: Secondary | ICD-10-CM | POA: Diagnosis not present

## 2016-02-20 DIAGNOSIS — D251 Intramural leiomyoma of uterus: Principal | ICD-10-CM | POA: Insufficient documentation

## 2016-02-20 HISTORY — PX: VAGINAL HYSTERECTOMY: SHX2639

## 2016-02-20 HISTORY — PX: RECTOCELE REPAIR: SHX761

## 2016-02-20 HISTORY — PX: SALPINGOOPHORECTOMY: SHX82

## 2016-02-20 LAB — POCT PREGNANCY, URINE: Preg Test, Ur: NEGATIVE

## 2016-02-20 SURGERY — HYSTERECTOMY, VAGINAL
Anesthesia: General | Wound class: Clean Contaminated

## 2016-02-20 MED ORDER — LACTATED RINGERS IV SOLN: INTRAVENOUS | Status: DC

## 2016-02-20 MED ORDER — ONDANSETRON HCL 4 MG PO TABS: 4.0000 mg | ORAL_TABLET | Freq: Four times a day (QID) | ORAL | Status: DC | PRN

## 2016-02-20 MED ORDER — FAMOTIDINE 20 MG PO TABS
20.0000 mg | ORAL_TABLET | Freq: Once | ORAL | Status: AC
  Administered 2016-02-20: 20 mg via ORAL

## 2016-02-20 MED ORDER — FENTANYL CITRATE (PF) 100 MCG/2ML IJ SOLN
INTRAMUSCULAR | Status: AC
  Administered 2016-02-20: 25 ug via INTRAVENOUS
  Filled 2016-02-20: qty 2

## 2016-02-20 MED ORDER — CEFOXITIN SODIUM-DEXTROSE 2-2.2 GM-% IV SOLR (PREMIX)
INTRAVENOUS | Status: AC
  Administered 2016-02-20: 2000 mg via INTRAVENOUS
  Filled 2016-02-20: qty 50

## 2016-02-20 MED ORDER — PROPOFOL 10 MG/ML IV BOLUS
INTRAVENOUS | Status: DC | PRN
  Administered 2016-02-20: 150 mg via INTRAVENOUS

## 2016-02-20 MED ORDER — HYDROMORPHONE HCL 1 MG/ML IJ SOLN
0.2500 mg | INTRAMUSCULAR | Status: DC | PRN
  Administered 2016-02-20: 0.25 mg via INTRAVENOUS
  Administered 2016-02-20: 0.5 mg via INTRAVENOUS
  Administered 2016-02-20 (×5): 0.25 mg via INTRAVENOUS

## 2016-02-20 MED ORDER — LIDOCAINE-EPINEPHRINE (PF) 1 %-1:200000 IJ SOLN
INTRAMUSCULAR | Status: AC
  Filled 2016-02-20: qty 30

## 2016-02-20 MED ORDER — MORPHINE SULFATE (PF) 2 MG/ML IV SOLN
1.0000 mg | INTRAVENOUS | Status: DC | PRN
  Administered 2016-02-20 – 2016-02-21 (×3): 2 mg via INTRAVENOUS
  Filled 2016-02-20 (×3): qty 1

## 2016-02-20 MED ORDER — ONDANSETRON HCL 4 MG/2ML IJ SOLN: 4.0000 mg | Freq: Once | INTRAMUSCULAR | Status: DC | PRN

## 2016-02-20 MED ORDER — MIDAZOLAM HCL 2 MG/2ML IJ SOLN
INTRAMUSCULAR | Status: DC | PRN
  Administered 2016-02-20: 2 mg via INTRAVENOUS

## 2016-02-20 MED ORDER — IBUPROFEN 800 MG PO TABS
800.0000 mg | ORAL_TABLET | Freq: Three times a day (TID) | ORAL | Status: DC | PRN
  Administered 2016-02-20 – 2016-02-21 (×2): 800 mg via ORAL
  Filled 2016-02-20 (×2): qty 1

## 2016-02-20 MED ORDER — SULFANILAMIDE 15 % VA CREA
TOPICAL_CREAM | VAGINAL | Status: AC
  Filled 2016-02-20: qty 120

## 2016-02-20 MED ORDER — FENTANYL CITRATE (PF) 100 MCG/2ML IJ SOLN
INTRAMUSCULAR | Status: DC | PRN
  Administered 2016-02-20 (×2): 50 ug via INTRAVENOUS
  Administered 2016-02-20: 100 ug via INTRAVENOUS

## 2016-02-20 MED ORDER — ONDANSETRON HCL 4 MG/2ML IJ SOLN
4.0000 mg | Freq: Four times a day (QID) | INTRAMUSCULAR | Status: DC | PRN
  Administered 2016-02-20: 4 mg via INTRAVENOUS
  Filled 2016-02-20: qty 2

## 2016-02-20 MED ORDER — OXYCODONE-ACETAMINOPHEN 5-325 MG PO TABS
1.0000 | ORAL_TABLET | ORAL | Status: DC | PRN
  Administered 2016-02-20 – 2016-02-21 (×5): 2 via ORAL
  Filled 2016-02-20 (×6): qty 2

## 2016-02-20 MED ORDER — LIDOCAINE HCL (CARDIAC) 20 MG/ML IV SOLN
INTRAVENOUS | Status: DC | PRN
  Administered 2016-02-20: 100 mg via INTRAVENOUS

## 2016-02-20 MED ORDER — LIDOCAINE HCL 2 % EX GEL
CUTANEOUS | Status: DC | PRN
  Administered 2016-02-20: 1 via TOPICAL

## 2016-02-20 MED ORDER — ACETAMINOPHEN 10 MG/ML IV SOLN
INTRAVENOUS | Status: AC
  Filled 2016-02-20: qty 100

## 2016-02-20 MED ORDER — GLYCOPYRROLATE 0.2 MG/ML IJ SOLN
INTRAMUSCULAR | Status: DC | PRN
  Administered 2016-02-20: 0.2 mg via INTRAVENOUS

## 2016-02-20 MED ORDER — VASOPRESSIN 20 UNIT/ML IV SOLN
INTRAVENOUS | Status: AC
  Filled 2016-02-20: qty 1

## 2016-02-20 MED ORDER — LACTATED RINGERS IV SOLN
INTRAVENOUS | Status: DC
  Administered 2016-02-21: 02:00:00 via INTRAVENOUS

## 2016-02-20 MED ORDER — ACETAMINOPHEN 10 MG/ML IV SOLN
INTRAVENOUS | Status: DC | PRN
  Administered 2016-02-20: 1000 mg via INTRAVENOUS

## 2016-02-20 MED ORDER — SILVER NITRATE-POT NITRATE 75-25 % EX MISC
CUTANEOUS | Status: AC
  Filled 2016-02-20: qty 1

## 2016-02-20 MED ORDER — LIDOCAINE-EPINEPHRINE 1 %-1:100000 IJ SOLN
INTRAMUSCULAR | Status: DC | PRN
  Administered 2016-02-20: 20 mL

## 2016-02-20 MED ORDER — ONDANSETRON HCL 4 MG/2ML IJ SOLN
INTRAMUSCULAR | Status: DC | PRN
  Administered 2016-02-20: 4 mg via INTRAVENOUS

## 2016-02-20 MED ORDER — ROCURONIUM BROMIDE 100 MG/10ML IV SOLN
INTRAVENOUS | Status: DC | PRN
  Administered 2016-02-20: 20 mg via INTRAVENOUS
  Administered 2016-02-20: 10 mg via INTRAVENOUS
  Administered 2016-02-20: 50 mg via INTRAVENOUS

## 2016-02-20 MED ORDER — HYDROMORPHONE HCL 1 MG/ML IJ SOLN
INTRAMUSCULAR | Status: AC
  Administered 2016-02-20: 0.25 mg via INTRAVENOUS
  Filled 2016-02-20: qty 1

## 2016-02-20 MED ORDER — SODIUM CHLORIDE 0.9 % IJ SOLN
INTRAMUSCULAR | Status: AC
  Filled 2016-02-20: qty 20

## 2016-02-20 MED ORDER — FENTANYL CITRATE (PF) 100 MCG/2ML IJ SOLN
25.0000 ug | INTRAMUSCULAR | Status: DC | PRN
  Administered 2016-02-20 (×4): 25 ug via INTRAVENOUS

## 2016-02-20 MED ORDER — DEXAMETHASONE SODIUM PHOSPHATE 10 MG/ML IJ SOLN
INTRAMUSCULAR | Status: DC | PRN
  Administered 2016-02-20: 10 mg via INTRAVENOUS

## 2016-02-20 MED ORDER — SODIUM CHLORIDE 0.9 % IJ SOLN
INTRAMUSCULAR | Status: AC
  Filled 2016-02-20: qty 50

## 2016-02-20 MED ORDER — FAMOTIDINE 20 MG PO TABS
ORAL_TABLET | ORAL | Status: AC
  Administered 2016-02-20: 20 mg via ORAL
  Filled 2016-02-20: qty 1

## 2016-02-20 MED ORDER — CEFOXITIN SODIUM-DEXTROSE 2-2.2 GM-% IV SOLR (PREMIX)
2.0000 g | INTRAVENOUS | Status: AC
  Administered 2016-02-20: 2000 mg via INTRAVENOUS

## 2016-02-20 MED ORDER — LACTATED RINGERS IV SOLN
INTRAVENOUS | Status: DC
  Administered 2016-02-20 (×2): via INTRAVENOUS

## 2016-02-20 SURGICAL SUPPLY — 39 items
BAG URO DRAIN 2000ML W/SPOUT (MISCELLANEOUS) ×4 IMPLANT
CANISTER SUCT 1200ML W/VALVE (MISCELLANEOUS) ×4 IMPLANT
CATH FOLEY 2WAY  5CC 16FR (CATHETERS) ×2
CATH ROBINSON RED A/P 16FR (CATHETERS) ×4 IMPLANT
CATH URTH 16FR FL 2W BLN LF (CATHETERS) ×2 IMPLANT
DRAPE PERI LITHO V/GYN (MISCELLANEOUS) ×4 IMPLANT
DRAPE SURG 17X11 SM STRL (DRAPES) ×4 IMPLANT
DRAPE UNDER BUTTOCK W/FLU (DRAPES) ×4 IMPLANT
ELECT REM PT RETURN 9FT ADLT (ELECTROSURGICAL) ×4
ELECTRODE REM PT RTRN 9FT ADLT (ELECTROSURGICAL) ×2 IMPLANT
ETHIBOND 2 0 GREEN CT 2 30IN (SUTURE) ×8 IMPLANT
GAUZE PACK 2X3YD (MISCELLANEOUS) ×4 IMPLANT
GLOVE BIO SURGEON STRL SZ8 (GLOVE) ×4 IMPLANT
GOWN STRL REUS W/ TWL LRG LVL3 (GOWN DISPOSABLE) ×6 IMPLANT
GOWN STRL REUS W/ TWL XL LVL3 (GOWN DISPOSABLE) ×2 IMPLANT
GOWN STRL REUS W/TWL LRG LVL3 (GOWN DISPOSABLE) ×6
GOWN STRL REUS W/TWL XL LVL3 (GOWN DISPOSABLE) ×2
KIT RM TURNOVER CYSTO AR (KITS) ×4 IMPLANT
KIT RM TURNOVER STRD PROC AR (KITS) ×4 IMPLANT
LABEL OR SOLS (LABEL) ×4 IMPLANT
NDL SAFETY 22GX1.5 (NEEDLE) ×4 IMPLANT
NS IRRIG 500ML POUR BTL (IV SOLUTION) ×4 IMPLANT
PACK BASIN MINOR ARMC (MISCELLANEOUS) ×4 IMPLANT
PAD OB MATERNITY 4.3X12.25 (PERSONAL CARE ITEMS) ×4 IMPLANT
PAD PREP 24X41 OB/GYN DISP (PERSONAL CARE ITEMS) ×4 IMPLANT
SPONGE XRAY 4X4 16PLY STRL (MISCELLANEOUS) ×4 IMPLANT
SUT PDS 2-0 27IN (SUTURE) ×4 IMPLANT
SUT PDS AB 2-0 CT1 27 (SUTURE) ×4 IMPLANT
SUT VIC AB 0 CT1 27 (SUTURE) ×8
SUT VIC AB 0 CT1 27XCR 8 STRN (SUTURE) ×8 IMPLANT
SUT VIC AB 0 CT1 36 (SUTURE) ×8 IMPLANT
SUT VIC AB 2-0 CT1 36 (SUTURE) ×20 IMPLANT
SUT VIC AB 2-0 SH 27 (SUTURE) ×8
SUT VIC AB 2-0 SH 27XBRD (SUTURE) ×8 IMPLANT
SUT VIC AB 3-0 SH 27 (SUTURE) ×4
SUT VIC AB 3-0 SH 27X BRD (SUTURE) ×4 IMPLANT
SYR CONTROL 10ML (SYRINGE) ×4 IMPLANT
SYRINGE 10CC LL (SYRINGE) ×4 IMPLANT
WATER STERILE IRR 1000ML POUR (IV SOLUTION) ×4 IMPLANT

## 2016-02-20 NOTE — Brief Op Note (Signed)
02/20/2016  9:54 AM  PATIENT:  Judith West  55 y.o. female  PRE-OPERATIVE DIAGNOSIS:  PMB, grade 3 rectocele POST-OPERATIVE DIAGNOSIS:  PMB, grade 3 rectocele PROCEDURE:  Procedure(s): HYSTERECTOMY VAGINAL (N/A) SALPINGO OOPHORECTOMY (Bilateral) POSTERIOR REPAIR (RECTOCELE) (N/A)  SURGEON:  Surgeon(s) and Role:    * Boykin Nearing, MD - Primary    * Benjaman Kindler, MD - Assisting  PHYSICIAN ASSISTANT: scrub tech   ASSISTANTS: none   ANESTHESIA:   general  EBL:  Total I/O In: 1000 [I.V.:1000] Out: 225 [Urine:75; Blood:150]  BLOOD ADMINISTERED:none  DRAINS: Urinary Catheter (Foley)   LOCAL MEDICATIONS USED:  LIDOCAINE 15 cc  SPECIMEN:  Source of Specimen:  cervix , uterus and bilateral tubes and ovaries   DISPOSITION OF SPECIMEN:  PATHOLOGY  COUNTS:  YES  TOURNIQUET:  * No tourniquets in log *  DICTATION: .Other Dictation: Dictation Number verbal   PLAN OF CARE: Admit for overnight observation  PATIENT DISPOSITION:  PACU - hemodynamically stable.   Delay start of Pharmacological VTE agent (>24hrs) due to surgical blood loss or risk of bleeding: not applicable

## 2016-02-20 NOTE — Anesthesia Procedure Notes (Signed)
Procedure Name: Intubation Date/Time: 02/20/2016 7:40 AM Performed by: Doreen Salvage Pre-anesthesia Checklist: Patient identified, Patient being monitored, Timeout performed, Emergency Drugs available and Suction available Patient Re-evaluated:Patient Re-evaluated prior to inductionOxygen Delivery Method: Circle system utilized Preoxygenation: Pre-oxygenation with 100% oxygen Intubation Type: IV induction and Cricoid Pressure applied Ventilation: Mask ventilation without difficulty Laryngoscope Size: Mac and 3 Grade View: Grade II Tube type: Oral Tube size: 7.0 mm Number of attempts: 1 Airway Equipment and Method: Stylet Placement Confirmation: ETT inserted through vocal cords under direct vision,  positive ETCO2 and breath sounds checked- equal and bilateral Secured at: 21 cm Tube secured with: Tape Dental Injury: Teeth and Oropharynx as per pre-operative assessment  Comments: Anterior Airway

## 2016-02-20 NOTE — Anesthesia Preprocedure Evaluation (Signed)
Anesthesia Evaluation  Patient identified by MRN, date of birth, ID band Patient awake    Reviewed: Allergy & Precautions, NPO status , Patient's Chart, lab work & pertinent test results, reviewed documented beta blocker date and time   Airway Mallampati: II  TM Distance: >3 FB     Dental  (+) Chipped   Pulmonary           Cardiovascular      Neuro/Psych    GI/Hepatic   Endo/Other  Hypothyroidism   Renal/GU      Musculoskeletal   Abdominal   Peds  Hematology  (+) anemia ,   Anesthesia Other Findings   Reproductive/Obstetrics                             Anesthesia Physical Anesthesia Plan  ASA: II  Anesthesia Plan: General   Post-op Pain Management:    Induction: Intravenous  Airway Management Planned: Oral ETT  Additional Equipment:   Intra-op Plan:   Post-operative Plan:   Informed Consent: I have reviewed the patients History and Physical, chart, labs and discussed the procedure including the risks, benefits and alternatives for the proposed anesthesia with the patient or authorized representative who has indicated his/her understanding and acceptance.     Plan Discussed with: CRNA  Anesthesia Plan Comments:         Anesthesia Quick Evaluation

## 2016-02-20 NOTE — Progress Notes (Signed)
Ready for TVH + BSO and posterior repair . NPO . Labs normal. Neg HCG . All questions answered .

## 2016-02-20 NOTE — Transfer of Care (Signed)
Immediate Anesthesia Transfer of Care Note  Patient: Judith West  Procedure(s) Performed: Procedure(s): HYSTERECTOMY VAGINAL (N/A) SALPINGO OOPHORECTOMY (Bilateral) POSTERIOR REPAIR (RECTOCELE) (N/A)  Patient Location: PACU  Anesthesia Type:General  Level of Consciousness: sedated  Airway & Oxygen Therapy: Patient Spontanous Breathing and Patient connected to face mask oxygen  Post-op Assessment: Report given to RN and Post -op Vital signs reviewed and stable  Post vital signs: Reviewed and stable  Last Vitals:  Filed Vitals:   02/20/16 0620 02/20/16 1004  BP: 124/69 135/84  Pulse: 63 105  Temp: 33.5 C 36.8 C  Resp: 18 21    Complications: No apparent anesthesia complications

## 2016-02-20 NOTE — Progress Notes (Signed)
DOS. TVH + BSO and Posterior repair . No c/o since taking her po meds  Urine output good . VSS. Scant blood on pad  D/C foley in am

## 2016-02-20 NOTE — Anesthesia Postprocedure Evaluation (Signed)
Anesthesia Post Note  Patient: Judith West  Procedure(s) Performed: Procedure(s) (LRB): HYSTERECTOMY VAGINAL (N/A) SALPINGO OOPHORECTOMY (Bilateral) POSTERIOR REPAIR (RECTOCELE) (N/A)  Patient location during evaluation: PACU Anesthesia Type: General Level of consciousness: awake and alert Pain management: pain level controlled Vital Signs Assessment: post-procedure vital signs reviewed and stable Respiratory status: spontaneous breathing, nonlabored ventilation, respiratory function stable and patient connected to nasal cannula oxygen Cardiovascular status: blood pressure returned to baseline and stable Postop Assessment: no signs of nausea or vomiting Anesthetic complications: no    Last Vitals:  Filed Vitals:   02/20/16 1130 02/20/16 1314  BP: 132/82 139/77  Pulse: 82 78  Temp: 37 C 36.9 C  Resp: 16 18    Last Pain:  Filed Vitals:   02/20/16 1458  PainSc: 2                  Lanitra Battaglini S

## 2016-02-21 DIAGNOSIS — D251 Intramural leiomyoma of uterus: Secondary | ICD-10-CM | POA: Diagnosis not present

## 2016-02-21 LAB — BASIC METABOLIC PANEL
ANION GAP: 5 (ref 5–15)
BUN: 8 mg/dL (ref 6–20)
CHLORIDE: 108 mmol/L (ref 101–111)
CO2: 24 mmol/L (ref 22–32)
CREATININE: 0.72 mg/dL (ref 0.44–1.00)
Calcium: 8.3 mg/dL — ABNORMAL LOW (ref 8.9–10.3)
GFR calc non Af Amer: >60 mL/min (ref >60)
Glucose, Bld: 119 mg/dL — ABNORMAL HIGH (ref 65–99)
Potassium: 3.9 mmol/L (ref 3.5–5.1)
Sodium: 137 mmol/L (ref 135–145)

## 2016-02-21 LAB — CBC
HEMATOCRIT: 30.4 % — AB (ref 35.0–47.0)
HEMOGLOBIN: 10.8 g/dL — AB (ref 12.0–16.0)
MCH: 31.2 pg (ref 26.0–34.0)
MCHC: 35.4 g/dL (ref 32.0–36.0)
MCV: 88 fL (ref 80.0–100.0)
Platelets: 190 10*3/uL (ref 150–440)
RBC: 3.46 MIL/uL — ABNORMAL LOW (ref 3.80–5.20)
RDW: 13.5 % (ref 11.5–14.5)
WBC: 9.2 10*3/uL (ref 3.6–11.0)

## 2016-02-21 MED ORDER — HYDROCODONE-ACETAMINOPHEN 5-325 MG PO TABS: 1.0000 | ORAL_TABLET | Freq: Four times a day (QID) | ORAL | Status: DC | PRN

## 2016-02-21 MED ORDER — ONDANSETRON HCL 4 MG PO TABS: 4.0000 mg | ORAL_TABLET | Freq: Four times a day (QID) | ORAL | Status: DC | PRN

## 2016-02-21 MED ORDER — IBUPROFEN 800 MG PO TABS: 800.0000 mg | ORAL_TABLET | Freq: Three times a day (TID) | ORAL | Status: AC | PRN

## 2016-02-21 NOTE — Discharge Summary (Signed)
Physician Discharge Summary  Patient ID: ILYANNA BIBER MRN: [id number] DOB/AGE: 11-30-1960 55 y.o.  Admit date: 02/20/2016 Discharge date: 02/21/2016  Admission Diagnoses:PMB ,  3rd degree rectocele  Discharge Diagnoses: same Active Problems:   Post-operative state   Discharged Condition: good  Hospital Course: TVH ./BSO  + posterior repair. Good urine output . Bun / cr : 8/0.7 , HCT 30 %  Consults: None  Significant Diagnostic Studies: noe  Treatments: surgery: as above  Discharge Exam: Blood pressure 111/59, pulse 58, temperature 98.2 F (36.8 C), temperature source Axillary, resp. rate 18, height 5\' 2"  (1.575 m), weight 137 lb (62.143 kg), last menstrual period 02/10/2015, SpO2 98 %. General appearance: alert and cooperative Resp: clear to auscultation bilaterally Cardio: regular rate and rhythm, S1, S2 normal, no murmur, click, rub or gallop GI: soft, non-tender; bowel sounds normal; no masses,  no organomegaly  Disposition: 01-Home or Self Care  Discharge Instructions    Call MD for:  difficulty breathing, headache or visual disturbances    Complete by:  As directed      Call MD for:  extreme fatigue    Complete by:  As directed      Call MD for:  hives    Complete by:  As directed      Call MD for:  persistant dizziness or light-headedness    Complete by:  As directed      Call MD for:  persistant nausea and vomiting    Complete by:  As directed      Call MD for:  redness, tenderness, or signs of infection (pain, swelling, redness, odor or green/yellow discharge around incision site)    Complete by:  As directed      Call MD for:  severe uncontrolled pain    Complete by:  As directed      Call MD for:  temperature >100.4    Complete by:  As directed      Diet - low sodium heart healthy    Complete by:  As directed      Discharge instructions    Complete by:  As directed   Pelvic rest 4 weeks , no heavy lifting above 15 #     Increase activity slowly     Complete by:  As directed             Medication List    STOP taking these medications        ferrous sulfate 325 (65 FE) MG tablet      TAKE these medications        aspirin 81 MG tablet  Take 81 mg by mouth daily.     b complex vitamins tablet  Take 1 tablet by mouth daily.     cetirizine 10 MG tablet  Commonly known as:  ZYRTEC  Take 10 mg by mouth daily.     FISH OIL OMEGA-3 PO  Take 1 capsule by mouth daily.     HYDROcodone-acetaminophen 5-325 MG tablet  Commonly known as:  NORCO  Take 1 tablet by mouth every 6 (six) hours as needed for moderate pain.     ibuprofen 800 MG tablet  Commonly known as:  ADVIL,MOTRIN  Take 1 tablet (800 mg total) by mouth every 8 (eight) hours as needed (mild pain).     levothyroxine 100 MCG tablet  Commonly known as:  SYNTHROID, LEVOTHROID  Take 100 mcg by mouth daily before breakfast.     multivitamin tablet  Take 1  tablet by mouth daily.     ondansetron 4 MG tablet  Commonly known as:  ZOFRAN  Take 1 tablet (4 mg total) by mouth every 6 (six) hours as needed for nausea.     solifenacin 10 MG tablet  Commonly known as:  VESICARE  Take 10 mg by mouth daily.     tamoxifen 20 MG tablet  Commonly known as:  NOLVADEX  Take 20 mg by mouth at bedtime.     TRUBIOTICS PO  Take 1 capsule by mouth daily.     venlafaxine XR 75 MG 24 hr capsule  Commonly known as:  EFFEXOR-XR  Take 75 mg by mouth daily with breakfast.           Follow-up Information    Follow up with SCHERMERHORN,THOMAS, MD In 2 weeks.   Specialty:  Obstetrics and Gynecology   Why:  For wound re-check   Contact information:   [address] [place] [address] [phone]       Signed: Laverta Baltimore 02/21/2016, 9:00 AM

## 2016-02-21 NOTE — Progress Notes (Signed)
Patient understands all discharge instructions and the need to attend follow up appointments. Patient discharge via wheelchair with auxillary. 

## 2016-02-21 NOTE — Op Note (Signed)
NAMEERNESHA, SCRIPTURE NO.:  [id number]  MEDICAL RECORD NO.:  [id number]  LOCATION:  [id number]                         FACILITY:  [facility]  PHYSICIAN:  Laverta Baltimore, MDDATE OF BIRTH:  [birth year]  DATE OF PROCEDURE:  02/20/2016 DATE OF DISCHARGE:                              OPERATIVE REPORT   PREOPERATIVE DIAGNOSIS: 1. Persistent postmenopausal bleeding. 2. Symptomatic rectocele, grade 3.  POSTOPERATIVE DIAGNOSIS: 1. Persistent postmenopausal bleeding. 2. Symptomatic rectocele, grade 3.  PROCEDURE PERFORMED: 1. Total vaginal hysterectomy, bilateral salpingo-oophorectomy. 2. Posterior colporrhaphy.  SURGEON:  Laverta Baltimore, MD  ANESTHESIA:  General endotracheal anesthesia.  SURGEON:  Laverta Baltimore, MD.  FIRST ASSISTANT: Benjaman Kindler, MD  INDICATIONS:  A 55 year old, gravida 3, para 3 patient with a previous postmenopausal bleeding, underwent dilation and curettage with no abnormal findings.  The patient continues to have postmenopausal bleeding as well as complaints of a symptomatic rectocele.  Patient was noted to have a grade 3 rectocele on examination and actually has to undergo vaginal splinting to have bowel movements.  DESCRIPTION OF PROCEDURE:  After adequate general endotracheal anesthesia, patient was placed in dorsal supine position.  The legs placed in candy-cane stirrups.  Lower abdomen, vagina, perineum were prepped and draped in normal sterile fashion.  The patient did receive 2 g IV cefoxitin prior to commencement of the case.  A time-out was performed.  A weighted speculum was placed in the posterior vaginal vault and the bladder was catheterized yielding 75 mL clear urine. Cervix was grasped with 2 thyroid tenacula and cervix was circumferentially injected with 1% lidocaine with 1:100,000 epinephrine. A direct posterior colpotomy incision was made upon entry into the posterior cul-de-sac.  Uterosacral ligaments were  bilaterally clamped, transected, suture ligated with 0 Vicryl suture.  The anterior cervix was circumscribed with the Bovie and the cardinal ligaments were then bilaterally clamped, transected, suture ligated with 0 Vicryl suture. Anterior cul-de-sac was then entered without difficulty.  Deaver retractor was placed within to elevate the bladder anteriorly.  Uterine arteries were then bilaterally clamped, transected, suture ligated with 0 Vicryl suture.  Sequential bites bilaterally in the broad ligament and ultimately the cornua were bilaterally clamped, transected, doubly ligated with 0 Vicryl suture.  Uterus did have to be cored out to aid in alternate identification of the cornua.  The ovaries and fallopian tubes were then bilaterally clamped, transected, and doubly ligated with 0 Vicryl suture.  Good hemostasis was noted.  The peritoneum was closed with 2-0 PDS suture in a pursestring fashion.  Vaginal cuff was then closed with a running 0 Vicryl suture, Nonlocking.  Attention was then directed to the posterior vaginal vault.  Hymenal ring was then grasped with 2 Allis clamps and a diamond shape resection was made after injecting with 1% lidocaine.  The vaginal epithelium was injected with lidocaine with epinephrine and posterior vaginal epithelium was opened centrally with Metzenbaum scissors.  Full extent of the rectocele was identified and the perirectal fascia was dissected from the epithelium. Ultimately, healthy perirectal fascia was then identified and horizontal mattress sutures were used to close the defect, 4 separate sutures were used.  The vaginal epithelium was then trimmed and the  rest of vaginal cuff was closed with 2-0 Vicryl suture.  Crown stitch suture was used to close the perineal body and the perineum epithelium was closed with a 3- 0 subcuticular suture, good cosmetic effect.  The vaginal vault did accommodate 2 fingers easily, i.e. there was not over suturing of  the vault or the perineal body.  There was good hemostasis at the end of the case.  Foley catheter was placed, yielded clear urine.  There were no complications.  The patient tolerated the procedure well.  ESTIMATED BLOOD LOSS:  125 mL.  INTRAOPERATIVE FLUIDS:  1200 mL.  URINE OUTPUT:  75+ additional 50 mL of urine.  The patient was taken to recovery room in good condition.          ______________________________ Laverta Baltimore, MD     TS/MEDQ  D:  02/20/2016  T:  02/21/2016  Job:  [id number]

## 2016-02-22 LAB — SURGICAL PATHOLOGY

## 2016-09-18 DIAGNOSIS — H524 Presbyopia: Secondary | ICD-10-CM | POA: Diagnosis not present

## 2016-09-18 DIAGNOSIS — D3131 Benign neoplasm of right choroid: Secondary | ICD-10-CM | POA: Diagnosis not present

## 2016-10-12 DIAGNOSIS — J069 Acute upper respiratory infection, unspecified: Secondary | ICD-10-CM | POA: Diagnosis not present

## 2016-10-18 DIAGNOSIS — J209 Acute bronchitis, unspecified: Secondary | ICD-10-CM | POA: Diagnosis not present

## 2016-10-30 DIAGNOSIS — C50912 Malignant neoplasm of unspecified site of left female breast: Secondary | ICD-10-CM | POA: Diagnosis not present

## 2016-10-30 DIAGNOSIS — Z170 Estrogen receptor positive status [ER+]: Secondary | ICD-10-CM | POA: Diagnosis not present

## 2017-01-04 DIAGNOSIS — K130 Diseases of lips: Secondary | ICD-10-CM | POA: Diagnosis not present

## 2017-01-04 DIAGNOSIS — D485 Neoplasm of uncertain behavior of skin: Secondary | ICD-10-CM | POA: Diagnosis not present

## 2017-01-04 DIAGNOSIS — L219 Seborrheic dermatitis, unspecified: Secondary | ICD-10-CM | POA: Diagnosis not present

## 2017-01-08 DIAGNOSIS — E559 Vitamin D deficiency, unspecified: Secondary | ICD-10-CM | POA: Diagnosis not present

## 2017-01-17 DIAGNOSIS — M25569 Pain in unspecified knee: Secondary | ICD-10-CM | POA: Diagnosis not present

## 2017-01-24 DIAGNOSIS — L02419 Cutaneous abscess of limb, unspecified: Secondary | ICD-10-CM | POA: Diagnosis not present

## 2017-05-27 DIAGNOSIS — R300 Dysuria: Secondary | ICD-10-CM | POA: Diagnosis not present

## 2017-05-27 DIAGNOSIS — N951 Menopausal and female climacteric states: Secondary | ICD-10-CM | POA: Diagnosis not present

## 2017-05-27 DIAGNOSIS — N3281 Overactive bladder: Secondary | ICD-10-CM | POA: Diagnosis not present

## 2017-06-12 DIAGNOSIS — C50912 Malignant neoplasm of unspecified site of left female breast: Secondary | ICD-10-CM | POA: Diagnosis not present

## 2017-06-12 DIAGNOSIS — Z23 Encounter for immunization: Secondary | ICD-10-CM | POA: Diagnosis not present

## 2017-06-12 DIAGNOSIS — Z170 Estrogen receptor positive status [ER+]: Secondary | ICD-10-CM | POA: Diagnosis not present

## 2017-06-25 DIAGNOSIS — R232 Flushing: Secondary | ICD-10-CM | POA: Diagnosis not present

## 2017-06-25 DIAGNOSIS — N3281 Overactive bladder: Secondary | ICD-10-CM | POA: Diagnosis not present

## 2017-06-25 DIAGNOSIS — R300 Dysuria: Secondary | ICD-10-CM | POA: Diagnosis not present

## 2017-07-04 DIAGNOSIS — Z0000 Encounter for general adult medical examination without abnormal findings: Secondary | ICD-10-CM | POA: Diagnosis not present

## 2017-07-04 DIAGNOSIS — S62619A Displaced fracture of proximal phalanx of unspecified finger, initial encounter for closed fracture: Secondary | ICD-10-CM | POA: Diagnosis not present

## 2017-07-04 DIAGNOSIS — E034 Atrophy of thyroid (acquired): Secondary | ICD-10-CM | POA: Diagnosis not present

## 2017-07-04 DIAGNOSIS — S6992XA Unspecified injury of left wrist, hand and finger(s), initial encounter: Secondary | ICD-10-CM | POA: Diagnosis not present

## 2017-07-04 DIAGNOSIS — M79645 Pain in left finger(s): Secondary | ICD-10-CM | POA: Diagnosis not present

## 2017-07-08 DIAGNOSIS — S62617A Displaced fracture of proximal phalanx of left little finger, initial encounter for closed fracture: Secondary | ICD-10-CM | POA: Diagnosis not present

## 2017-07-11 DIAGNOSIS — C50912 Malignant neoplasm of unspecified site of left female breast: Secondary | ICD-10-CM | POA: Diagnosis not present

## 2017-07-11 DIAGNOSIS — E034 Atrophy of thyroid (acquired): Secondary | ICD-10-CM | POA: Diagnosis not present

## 2017-07-11 DIAGNOSIS — Z0000 Encounter for general adult medical examination without abnormal findings: Secondary | ICD-10-CM | POA: Diagnosis not present

## 2017-08-08 DIAGNOSIS — J0140 Acute pansinusitis, unspecified: Secondary | ICD-10-CM | POA: Diagnosis not present

## 2017-08-08 DIAGNOSIS — R490 Dysphonia: Secondary | ICD-10-CM | POA: Diagnosis not present

## 2017-08-08 DIAGNOSIS — L570 Actinic keratosis: Secondary | ICD-10-CM | POA: Diagnosis not present

## 2017-08-08 DIAGNOSIS — K130 Diseases of lips: Secondary | ICD-10-CM | POA: Diagnosis not present

## 2017-08-28 DIAGNOSIS — D485 Neoplasm of uncertain behavior of skin: Secondary | ICD-10-CM | POA: Diagnosis not present

## 2017-09-09 DIAGNOSIS — M2242 Chondromalacia patellae, left knee: Secondary | ICD-10-CM | POA: Diagnosis not present

## 2017-09-27 DIAGNOSIS — D3131 Benign neoplasm of right choroid: Secondary | ICD-10-CM | POA: Diagnosis not present

## 2017-09-27 DIAGNOSIS — H5213 Myopia, bilateral: Secondary | ICD-10-CM | POA: Diagnosis not present

## 2017-10-02 DIAGNOSIS — D485 Neoplasm of uncertain behavior of skin: Secondary | ICD-10-CM | POA: Diagnosis not present

## 2017-10-02 DIAGNOSIS — L568 Other specified acute skin changes due to ultraviolet radiation: Secondary | ICD-10-CM | POA: Diagnosis not present

## 2017-10-02 DIAGNOSIS — L814 Other melanin hyperpigmentation: Secondary | ICD-10-CM | POA: Diagnosis not present

## 2017-10-03 DIAGNOSIS — M2242 Chondromalacia patellae, left knee: Secondary | ICD-10-CM | POA: Diagnosis not present

## 2017-10-04 DIAGNOSIS — M6281 Muscle weakness (generalized): Secondary | ICD-10-CM | POA: Diagnosis not present

## 2017-10-04 DIAGNOSIS — M25562 Pain in left knee: Secondary | ICD-10-CM | POA: Diagnosis not present

## 2017-10-08 DIAGNOSIS — M25562 Pain in left knee: Secondary | ICD-10-CM | POA: Diagnosis not present

## 2017-10-08 DIAGNOSIS — M6281 Muscle weakness (generalized): Secondary | ICD-10-CM | POA: Diagnosis not present

## 2017-10-24 DIAGNOSIS — L570 Actinic keratosis: Secondary | ICD-10-CM | POA: Diagnosis not present

## 2017-10-31 DIAGNOSIS — C50912 Malignant neoplasm of unspecified site of left female breast: Secondary | ICD-10-CM | POA: Diagnosis not present

## 2017-10-31 DIAGNOSIS — Z170 Estrogen receptor positive status [ER+]: Secondary | ICD-10-CM | POA: Diagnosis not present

## 2017-11-21 DIAGNOSIS — L570 Actinic keratosis: Secondary | ICD-10-CM | POA: Diagnosis not present

## 2017-12-05 DIAGNOSIS — H8111 Benign paroxysmal vertigo, right ear: Secondary | ICD-10-CM | POA: Diagnosis not present

## 2017-12-05 DIAGNOSIS — R42 Dizziness and giddiness: Secondary | ICD-10-CM | POA: Diagnosis not present

## 2018-01-22 DIAGNOSIS — L570 Actinic keratosis: Secondary | ICD-10-CM | POA: Diagnosis not present

## 2018-02-19 DIAGNOSIS — D72818 Other decreased white blood cell count: Secondary | ICD-10-CM | POA: Diagnosis not present

## 2018-02-19 DIAGNOSIS — E785 Hyperlipidemia, unspecified: Secondary | ICD-10-CM | POA: Diagnosis not present

## 2018-02-19 DIAGNOSIS — R739 Hyperglycemia, unspecified: Secondary | ICD-10-CM | POA: Diagnosis not present

## 2018-02-19 DIAGNOSIS — E559 Vitamin D deficiency, unspecified: Secondary | ICD-10-CM | POA: Diagnosis not present

## 2018-02-25 DIAGNOSIS — E034 Atrophy of thyroid (acquired): Secondary | ICD-10-CM | POA: Diagnosis not present

## 2018-02-25 DIAGNOSIS — C50912 Malignant neoplasm of unspecified site of left female breast: Secondary | ICD-10-CM | POA: Diagnosis not present

## 2018-02-25 DIAGNOSIS — E559 Vitamin D deficiency, unspecified: Secondary | ICD-10-CM | POA: Diagnosis not present

## 2018-04-03 DIAGNOSIS — L570 Actinic keratosis: Secondary | ICD-10-CM | POA: Diagnosis not present

## 2018-04-23 DIAGNOSIS — E034 Atrophy of thyroid (acquired): Secondary | ICD-10-CM | POA: Diagnosis not present

## 2018-04-23 DIAGNOSIS — D72818 Other decreased white blood cell count: Secondary | ICD-10-CM | POA: Diagnosis not present

## 2018-06-03 DIAGNOSIS — Z23 Encounter for immunization: Secondary | ICD-10-CM | POA: Diagnosis not present

## 2018-06-05 DIAGNOSIS — R110 Nausea: Secondary | ICD-10-CM | POA: Diagnosis not present

## 2018-06-05 DIAGNOSIS — R197 Diarrhea, unspecified: Secondary | ICD-10-CM | POA: Diagnosis not present

## 2018-06-18 DIAGNOSIS — Z170 Estrogen receptor positive status [ER+]: Secondary | ICD-10-CM | POA: Diagnosis not present

## 2018-06-18 DIAGNOSIS — C50812 Malignant neoplasm of overlapping sites of left female breast: Secondary | ICD-10-CM | POA: Diagnosis not present

## 2018-07-28 DIAGNOSIS — R6889 Other general symptoms and signs: Secondary | ICD-10-CM | POA: Diagnosis not present

## 2018-07-28 DIAGNOSIS — J029 Acute pharyngitis, unspecified: Secondary | ICD-10-CM | POA: Diagnosis not present

## 2018-07-28 DIAGNOSIS — J040 Acute laryngitis: Secondary | ICD-10-CM | POA: Diagnosis not present

## 2018-09-08 DIAGNOSIS — R030 Elevated blood-pressure reading, without diagnosis of hypertension: Secondary | ICD-10-CM | POA: Diagnosis not present

## 2018-09-08 DIAGNOSIS — R739 Hyperglycemia, unspecified: Secondary | ICD-10-CM | POA: Diagnosis not present

## 2018-09-08 DIAGNOSIS — D72818 Other decreased white blood cell count: Secondary | ICD-10-CM | POA: Diagnosis not present

## 2018-09-11 DIAGNOSIS — K219 Gastro-esophageal reflux disease without esophagitis: Secondary | ICD-10-CM | POA: Diagnosis not present

## 2018-09-11 DIAGNOSIS — E034 Atrophy of thyroid (acquired): Secondary | ICD-10-CM | POA: Diagnosis not present

## 2018-09-11 DIAGNOSIS — Z0000 Encounter for general adult medical examination without abnormal findings: Secondary | ICD-10-CM | POA: Diagnosis not present

## 2020-11-02 ENCOUNTER — Ambulatory Visit: Payer: 59 | Admitting: Dermatology

## 2021-07-28 ENCOUNTER — Other Ambulatory Visit: Payer: Self-pay | Admitting: Internal Medicine

## 2021-07-28 DIAGNOSIS — M5090 Cervical disc disorder, unspecified, unspecified cervical region: Secondary | ICD-10-CM

## 2021-07-28 DIAGNOSIS — Z170 Estrogen receptor positive status [ER+]: Secondary | ICD-10-CM

## 2021-08-08 ENCOUNTER — Ambulatory Visit: Admission: RE | Admit: 2021-08-08 | Payer: 59 | Source: Ambulatory Visit

## 2021-08-09 ENCOUNTER — Ambulatory Visit
Admission: RE | Admit: 2021-08-09 | Discharge: 2021-08-09 | Disposition: A | Discharge status: Home / Self Care | Payer: 59 | Source: Ambulatory Visit | Attending: Internal Medicine | Admitting: Internal Medicine

## 2021-08-09 ENCOUNTER — Other Ambulatory Visit: Payer: Self-pay

## 2021-08-09 DIAGNOSIS — Z170 Estrogen receptor positive status [ER+]: Secondary | ICD-10-CM

## 2021-08-09 DIAGNOSIS — C50812 Malignant neoplasm of overlapping sites of left female breast: Secondary | ICD-10-CM | POA: Diagnosis present

## 2021-08-09 DIAGNOSIS — M5090 Cervical disc disorder, unspecified, unspecified cervical region: Secondary | ICD-10-CM

## 2022-01-17 ENCOUNTER — Encounter: Payer: Self-pay | Admitting: Dermatology

## 2022-01-17 ENCOUNTER — Ambulatory Visit: Payer: 59 | Admitting: Dermatology

## 2022-01-17 DIAGNOSIS — D1800 Hemangioma unspecified site: Secondary | ICD-10-CM

## 2022-01-17 DIAGNOSIS — Z1283 Encounter for screening for malignant neoplasm of skin: Secondary | ICD-10-CM

## 2022-01-17 DIAGNOSIS — L821 Other seborrheic keratosis: Secondary | ICD-10-CM

## 2022-01-17 DIAGNOSIS — D229 Melanocytic nevi, unspecified: Secondary | ICD-10-CM

## 2022-01-17 DIAGNOSIS — L814 Other melanin hyperpigmentation: Secondary | ICD-10-CM

## 2022-01-17 DIAGNOSIS — L578 Other skin changes due to chronic exposure to nonionizing radiation: Secondary | ICD-10-CM

## 2022-01-17 DIAGNOSIS — D225 Melanocytic nevi of trunk: Secondary | ICD-10-CM

## 2022-01-17 NOTE — Progress Notes (Signed)
   Follow-Up Visit   Subjective  Judith West is a 61 y.o. female who presents for the following: Annual Exam.  The patient presents for Total-Body Skin Exam (TBSE) for skin cancer screening and mole check.  The patient has spots, moles and lesions to be evaluated, some may be new or changing. She has a spot on her back that has been itchy in the past. 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 History of AKs.    The following portions of the chart were reviewed this encounter and updated as appropriate:       Review of Systems:  No other skin or systemic complaints except as noted in HPI or Assessment and Plan.  Objective  Well appearing patient in no apparent distress; mood and affect are within normal limits.  A full examination was performed including scalp, head, eyes, ears, nose, lips, neck, chest, axillae, abdomen, back, buttocks, bilateral upper extremities, bilateral lower extremities, hands, feet, fingers, toes, fingernails, and toenails. All findings within normal limits unless otherwise noted below.  mid upper back Medium dark brown macules.  Right Spinal Upper Back Pink flesh papule.         Assessment & Plan  Skin cancer screening performed today.  Actinic Damage - chronic, secondary to cumulative UV radiation exposure/sun exposure over time - diffuse scaly erythematous macules with underlying dyspigmentation - Recommend daily broad spectrum sunscreen SPF 30+ to sun-exposed areas, reapply every 2 hours as needed.  - Recommend staying in the shade or wearing long sleeves, sun glasses (UVA+UVB protection) and wide brim hats (4-inch brim around the entire circumference of the hat). - Call for new or changing lesions.  Lentigines - Scattered tan macules - Due to sun exposure - Benign-appering, observe - Recommend daily broad spectrum sunscreen SPF 30+ to sun-exposed areas, reapply every 2 hours as needed. -  Call for any changes  Seborrheic Keratoses - Stuck-on, waxy, tan-brown papules and/or plaques, including right anterior shoulder, right posterior thigh  - Benign-appearing - Discussed benign etiology and prognosis. - Observe - Call for any changes  Melanocytic Nevi - Tan-brown and/or pink-flesh-colored symmetric macules and papules - Benign appearing on exam today - Observation - Call clinic for new or changing moles - Recommend daily use of broad spectrum spf 30+ sunscreen to sun-exposed areas.   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  - Call if any new or changing lesions are noted between office visits  Nevus (2) Right Spinal Upper Back; mid upper back  Benign-appearing.  Observation.  Call clinic for new or changing moles.  Recommend daily use of broad spectrum spf 30+ sunscreen to sun-exposed areas.   Irritated nevus on R spinal upper back, discussed shave removal, pt defers at this time   Return in about 1 year (around 01/18/2023) for TBSE.  IJamesetta Orleans, CMA, am acting as scribe for Brendolyn Patty, MD . Documentation: I have reviewed the above documentation for accuracy and completeness, and I agree with the above.  Brendolyn Patty MD

## 2022-01-17 NOTE — Patient Instructions (Addendum)
Seborrheic Keratosis  What causes seborrheic keratoses? Seborrheic keratoses are harmless, common skin growths that first appear during adult life.  As time goes by, more growths appear.  Some people may develop a large number of them.  Seborrheic keratoses appear on both covered and uncovered body parts.  They are not caused by sunlight.  The tendency to develop seborrheic keratoses can be inherited.  They vary in color from skin-colored to gray, brown, or even black.  They can be either smooth or have a rough, warty surface.   Seborrheic keratoses are superficial and look as if they were stuck on the skin.  Under the microscope this type of keratosis looks like layers upon layers of skin.  That is why at times the top layer may seem to fall off, but the rest of the growth remains and re-grows.    Treatment Seborrheic keratoses do not need to be treated, but can easily be removed in the office.  Seborrheic keratoses often cause symptoms when they rub on clothing or jewelry.  Lesions can be in the way of shaving.  If they become inflamed, they can cause itching, soreness, or burning.  Removal of a seborrheic keratosis can be accomplished by freezing, burning, or surgery. If any spot bleeds, scabs, or grows rapidly, please return to have it checked, as these can be an indication of a skin cancer.   Melanoma ABCDEs  Melanoma is the most dangerous type of skin cancer, and is the leading cause of death from skin disease.  You are more likely to develop melanoma if you: Have light-colored skin, light-colored eyes, or red or blond hair Spend a lot of time in the sun Tan regularly, either outdoors or in a tanning bed Have had blistering sunburns, especially during childhood Have a close family member who has had a melanoma Have atypical moles or large birthmarks  Early detection of melanoma is key since treatment is typically straightforward and cure rates are extremely high if we catch it early.    The first sign of melanoma is often a change in a mole or a new dark spot.  The ABCDE system is a way of remembering the signs of melanoma.  A for asymmetry:  The two halves do not match. B for border:  The edges of the growth are irregular. C for color:  A mixture of colors are present instead of an even brown color. D for diameter:  Melanomas are usually (but not always) greater than 75m - the size of a pencil eraser. E for evolution:  The spot keeps changing in size, shape, and color.  Please check your skin once per month between visits. You can use a small mirror in front and a large mirror behind you to keep an eye on the back side or your body.   If you see any new or changing lesions before your next follow-up, please call to schedule a visit.  Please continue daily skin protection including broad spectrum sunscreen SPF 30+ to sun-exposed areas, reapplying every 2 hours as needed when you're outdoors.   Staying in the shade or wearing long sleeves, sun glasses (UVA+UVB protection) and wide brim hats (4-inch brim around the entire circumference of the hat) are also recommended for sun protection.      If You Need Anything After Your Visit  If you have any questions or concerns for your doctor, please call our main line at [phone] press option 4 to reach your doctor's medical assistant.  If no one answers, please leave a voicemail as directed and we will return your call as soon as possible. Messages left after 4 pm will be answered the following business day.   You may also send [place] a message via [place]. We typically respond to MyChart messages within 1-2 business days.  For prescription refills, please ask your pharmacy to contact our office. Our fax number is [phone].  If you have an urgent issue when the clinic is closed that cannot wait until the next business day, you can page your doctor at the number below.    Please note that while we do our best to be  available for urgent issues outside of office hours, we are not available 24/7.   If you have an urgent issue and are unable to reach [place], you may choose to seek medical care at your doctor's office, retail clinic, urgent care center, or emergency room.  If you have a medical emergency, please immediately call 911 or go to the emergency department.  Pager Numbers  - Dr. Nehemiah Massed: [phone]  - Dr. Laurence Ferrari: [phone]  - Dr. Nicole Kindred: [phone]  In the event of inclement weather, please call our main line at [phone] for an update on the status of any delays or closures.  Dermatology Medication Tips: Please keep the boxes that topical medications come in in order to help keep track of the instructions about where and how to use these. Pharmacies typically print the medication instructions only on the boxes and not directly on the medication tubes.   If your medication is too expensive, please contact our office at [phone] option 4 or send [place] a message through [place].   We are unable to tell what your co-pay for medications will be in advance as this is different depending on your insurance coverage. However, we may be able to find a substitute medication at lower cost or fill out paperwork to get insurance to cover a needed medication.   If a prior authorization is required to get your medication covered by your insurance company, please allow [place] 1-2 business days to complete this process.  Drug prices often vary depending on where the prescription is filled and some pharmacies may offer cheaper prices.  The website www.goodrx.com contains coupons for medications through different pharmacies. The prices here do not account for what the cost may be with help from insurance (it may be cheaper with your insurance), but the website can give you the price if you did not use any insurance.  - You can print the associated coupon and take it with your prescription to the pharmacy.  -  You may also stop by our office during regular business hours and pick up a GoodRx coupon card.  - If you need your prescription sent electronically to a different pharmacy, notify our office through [facility] or by phone at [phone] option 4.     Si Usted Necesita Algo Despus de Su Visita  Tambin puede enviarnos un mensaje a travs de Pharmacist, community. Por lo general respondemos a los mensajes de MyChart en el transcurso de 1 a 2 das hbiles.  Para renovar recetas, por favor pida a su farmacia que se ponga en contacto con nuestra oficina. Harland Dingwall de fax es [place] [phone].  Si tiene un asunto urgente cuando la clnica est cerrada y que no puede esperar hasta el siguiente da hbil, puede llamar/localizar a su doctor(a) al nmero que aparece a continuacin.   Por favor, tenga  en cuenta que aunque hacemos todo lo posible para estar disponibles para asuntos urgentes fuera del horario de [place], no estamos disponibles las 24 horas del da, los 7 das de la [place].   Si tiene un problema urgente y no puede comunicarse con nosotros, puede optar por buscar atencin mdica  en el consultorio de su doctor(a), en una clnica privada, en un centro de atencin urgente o en una sala de emergencias.  Si tiene Engineering geologist, por favor llame inmediatamente al 911 o vaya a la sala de emergencias.  Nmeros de bper  - Dr. Nehemiah Massed: [phone]  - Dra. Moye: [phone]  - Dra. Nicole Kindred: [phone]  En caso de inclemencias del [place], por favor llame a Johnsie Kindred principal al [phone] para una actualizacin sobre el [place] de cualquier retraso o cierre.  Consejos para la medicacin en dermatologa: Por favor, guarde las cajas en las que vienen los medicamentos de uso tpico para ayudarle a seguir las instrucciones sobre dnde y cmo usarlos. Las farmacias generalmente imprimen las instrucciones del medicamento slo en las cajas y no directamente en los tubos del  [place].   Si su medicamento es muy caro, por favor, pngase en contacto con Zigmund Daniel llamando al [phone] y presione la opcin 4 o envenos un mensaje a travs de Pharmacist, community.   No podemos decirle cul ser su copago por los medicamentos por adelantado ya que esto es diferente dependiendo de la cobertura de su seguro. Sin embargo, es posible que podamos encontrar un medicamento sustituto a Electrical engineer un formulario para que el seguro cubra el medicamento que se considera necesario.   Si se requiere una autorizacin previa para que su compaa de seguros [place] su medicamento, por favor permtanos de 1 a 2 das hbiles para completar este proceso.  Los precios de los medicamentos varan con frecuencia dependiendo del Environmental consultant de dnde se surte la receta y alguna farmacias pueden ofrecer precios ms baratos.  El sitio web www.goodrx.com tiene cupones para medicamentos de Airline pilot. Los precios aqu no tienen en cuenta lo que podra costar con la ayuda del seguro (puede ser ms barato con su seguro), pero el sitio web puede darle el precio si no utiliz Research scientist (physical sciences).  - Puede imprimir el cupn correspondiente y llevarlo con su receta a la farmacia.  - Tambin puede pasar por nuestra oficina durante el horario de atencin regular y Charity fundraiser una tarjeta de cupones de GoodRx.  - Si necesita que su receta se enve electrnicamente a una farmacia diferente, informe a nuestra oficina a travs de MyChart de [place] o por telfono llamando al [phone] y presione la opcin 4.

## 2022-11-08 ENCOUNTER — Encounter: Payer: Self-pay | Admitting: *Deleted

## 2022-11-09 ENCOUNTER — Ambulatory Visit
Admission: RE | Admit: 2022-11-09 | Discharge: 2022-11-09 | Disposition: A | Discharge status: Home / Self Care | Payer: No Typology Code available for payment source | Attending: Gastroenterology | Admitting: Gastroenterology

## 2022-11-09 ENCOUNTER — Encounter
Admission: RE | Disposition: A | Discharge status: Home / Self Care | Payer: Self-pay | Source: Home / Self Care | Attending: Gastroenterology

## 2022-11-09 ENCOUNTER — Ambulatory Visit: Payer: No Typology Code available for payment source | Admitting: Certified Registered"

## 2022-11-09 DIAGNOSIS — Z79899 Other long term (current) drug therapy: Secondary | ICD-10-CM | POA: Insufficient documentation

## 2022-11-09 DIAGNOSIS — K640 First degree hemorrhoids: Secondary | ICD-10-CM | POA: Diagnosis not present

## 2022-11-09 DIAGNOSIS — E039 Hypothyroidism, unspecified: Secondary | ICD-10-CM | POA: Diagnosis not present

## 2022-11-09 DIAGNOSIS — Z800 Family history of malignant neoplasm of digestive organs: Secondary | ICD-10-CM | POA: Diagnosis not present

## 2022-11-09 DIAGNOSIS — Z1211 Encounter for screening for malignant neoplasm of colon: Secondary | ICD-10-CM | POA: Insufficient documentation

## 2022-11-09 DIAGNOSIS — Z79890 Hormone replacement therapy (postmenopausal): Secondary | ICD-10-CM | POA: Diagnosis not present

## 2022-11-09 HISTORY — PX: COLONOSCOPY WITH PROPOFOL: SHX5780

## 2022-11-09 SURGERY — COLONOSCOPY WITH PROPOFOL
Anesthesia: General

## 2022-11-09 MED ORDER — LIDOCAINE HCL (CARDIAC) PF 100 MG/5ML IV SOSY
PREFILLED_SYRINGE | INTRAVENOUS | Status: DC | PRN
  Administered 2022-11-09: 50 mg via INTRAVENOUS

## 2022-11-09 MED ORDER — PROPOFOL 10 MG/ML IV BOLUS
INTRAVENOUS | Status: DC | PRN
  Administered 2022-11-09: 20 mg via INTRAVENOUS
  Administered 2022-11-09: 80 mg via INTRAVENOUS

## 2022-11-09 MED ORDER — PROPOFOL 1000 MG/100ML IV EMUL
INTRAVENOUS | Status: AC
  Filled 2022-11-09: qty 100

## 2022-11-09 MED ORDER — PROPOFOL 500 MG/50ML IV EMUL
INTRAVENOUS | Status: DC | PRN
  Administered 2022-11-09: 125 ug/kg/min via INTRAVENOUS

## 2022-11-09 MED ORDER — SODIUM CHLORIDE 0.9 % IV SOLN
INTRAVENOUS | Status: DC
  Administered 2022-11-09: 20 mL/h via INTRAVENOUS

## 2022-11-09 NOTE — Op Note (Signed)
[facility] Gastroenterology Patient Name: Judith West Procedure Date: 11/09/2022 9:33 AM MRN: [id number] Account #: [id number] Date of Birth: [birth year] Admit Type: Outpatient Age: 62 Room: [facility] ENDO ROOM 3 Gender: Female Note Status: Finalized Instrument Name: Jasper Riling [id number] Procedure:             Colonoscopy Indications:           Screening for colorectal malignant neoplasm Providers:             Andrey Farmer MD, MD Referring MD:          Ramonita Lab, MD (Referring MD) Medicines:             Monitored Anesthesia Care Complications:         No immediate complications. Procedure:             Pre-Anesthesia Assessment:                        - Prior to the procedure, a History and Physical was                         performed, and patient medications and allergies were                         reviewed. The patient is competent. The risks and                         benefits of the procedure and the sedation options and                         risks were discussed with the patient. All questions                         were answered and informed consent was obtained.                         Patient identification and proposed procedure were                         verified by the physician, the nurse, the                         anesthesiologist, the anesthetist and the technician                         in the endoscopy suite. Mental Status Examination:                         alert and oriented. Airway Examination: normal                         oropharyngeal airway and neck mobility. Respiratory                         Examination: clear to auscultation. CV Examination:                         normal. Prophylactic Antibiotics: The patient does not  require prophylactic antibiotics. Prior                         Anticoagulants: The patient has taken no anticoagulant                         or antiplatelet agents. ASA Grade  Assessment: II - A                         patient with mild systemic disease. After reviewing                         the risks and benefits, the patient was deemed in                         satisfactory condition to undergo the procedure. The                         anesthesia plan was to use monitored anesthesia care                         (MAC). Immediately prior to administration of                         medications, the patient was re-assessed for adequacy                         to receive sedatives. The heart rate, respiratory                         rate, oxygen saturations, blood pressure, adequacy of                         pulmonary ventilation, and response to care were                         monitored throughout the procedure. The physical                         status of the patient was re-assessed after the                         procedure.                        After obtaining informed consent, the colonoscope was                         passed under direct vision. Throughout the procedure,                         the patient's blood pressure, pulse, and oxygen                         saturations were monitored continuously. The                         Colonoscope was introduced through the anus and  advanced to the the cecum, identified by appendiceal                         orifice and ileocecal valve. The colonoscopy was                         performed without difficulty. The patient tolerated                         the procedure well. The quality of the bowel                         preparation was good. The ileocecal valve, appendiceal                         orifice, and rectum were photographed. Findings:      The perianal and digital rectal examinations were normal.      Internal hemorrhoids were found during retroflexion. The hemorrhoids       were Grade I (internal hemorrhoids that do not prolapse).      The exam was otherwise  without abnormality on direct and retroflexion       views. Impression:            - Internal hemorrhoids.                        - The examination was otherwise normal on direct and                         retroflexion views.                        - No specimens collected. Recommendation:        - Discharge patient to home.                        - Resume previous diet.                        - Continue present medications.                        - Repeat colonoscopy in 10 years for screening                         purposes.                        - Return to referring physician as previously                         scheduled. Procedure Code(s):     --- Professional ---                        O4599, Colorectal cancer screening; colonoscopy on                         individual not meeting criteria for high risk Diagnosis Code(s):     --- Professional ---  Z12.11, Encounter for screening for malignant neoplasm                         of colon                        K64.0, First degree hemorrhoids CPT copyright 2022 American Medical Association. All rights reserved. The codes documented in this report are preliminary and upon coder review may  be revised to meet current compliance requirements. Andrey Farmer MD, MD 11/09/2022 9:57:04 AM Number of Addenda: 0 Note Initiated On: 11/09/2022 9:33 AM Scope Withdrawal Time: 0 hours 7 minutes 18 seconds  Total Procedure Duration: 0 hours 13 minutes 17 seconds  Estimated Blood Loss:  Estimated blood loss: none.      [facility]

## 2022-11-09 NOTE — Transfer of Care (Signed)
Immediate Anesthesia Transfer of Care Note  Patient: Judith West  Procedure(s) Performed: COLONOSCOPY WITH PROPOFOL  Patient Location: PACU  Anesthesia Type:General  Level of Consciousness: awake, alert , and oriented  Airway & Oxygen Therapy: Patient Spontanous Breathing  Post-op Assessment: Report given to RN and Post -op Vital signs reviewed and stable  Post vital signs: stable  Last Vitals:  Vitals Value Taken Time  BP 102/71 11/09/22 0959  Temp 36.1 C 11/09/22 0957  Pulse 74 11/09/22 1000  Resp 12 11/09/22 1000  SpO2 99 % 11/09/22 1000  Vitals shown include unvalidated device data.  Last Pain:  Vitals:   11/09/22 0957  TempSrc: Temporal  PainSc:          Complications: No notable events documented.

## 2022-11-09 NOTE — Anesthesia Preprocedure Evaluation (Signed)
Anesthesia Evaluation  Patient identified by MRN, date of birth, ID band Patient awake    Reviewed: Allergy & Precautions, NPO status , Patient's Chart, lab work & pertinent test results  History of Anesthesia Complications Negative for: history of anesthetic complications  Airway Mallampati: II  TM Distance: >3 FB Neck ROM: Full    Dental no notable dental hx. (+) Teeth Intact   Pulmonary neg pulmonary ROS, neg sleep apnea, neg COPD, Patient abstained from smoking.Not current smoker   Pulmonary exam normal breath sounds clear to auscultation       Cardiovascular Exercise Tolerance: Good METS(-) hypertension(-) CAD and (-) Past MI negative cardio ROS (-) dysrhythmias  Rhythm:Regular Rate:Normal - Systolic murmurs    Neuro/Psych negative neurological ROS  negative psych ROS   GI/Hepatic ,neg GERD  ,,(+)     (-) substance abuse    Endo/Other  neg diabetesHypothyroidism    Renal/GU negative Renal ROS     Musculoskeletal   Abdominal   Peds  Hematology   Anesthesia Other Findings Past Medical History: No date: Anemia No date: Atypical mole     Comment:  right posterior shoulder. Tx by Dr Sharlett Iles in the               past. 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 No date: Hypothyroidism  Reproductive/Obstetrics                             Anesthesia Physical Anesthesia Plan  ASA: 2  Anesthesia Plan: General   Post-op Pain Management: Minimal or no pain anticipated   Induction: Intravenous  PONV Risk Score and Plan: 3 and Propofol infusion, TIVA and Ondansetron  Airway Management Planned: Nasal Cannula  Additional Equipment: None  Intra-op Plan:   Post-operative Plan:   Informed Consent: I have reviewed the patients History and Physical, chart, labs and discussed the procedure including the risks, benefits and  alternatives for the proposed anesthesia with the patient or authorized representative who has indicated his/her understanding and acceptance.     Dental advisory given  Plan Discussed with: CRNA and Surgeon  Anesthesia Plan Comments: (Discussed risks of anesthesia with patient, including possibility of difficulty with spontaneous ventilation under anesthesia necessitating airway intervention, PONV, and rare risks such as cardiac or respiratory or neurological events, and allergic reactions. Discussed the role of CRNA in patient's perioperative care. Patient understands.)       Anesthesia Quick Evaluation

## 2022-11-09 NOTE — H&P (Signed)
Outpatient short stay form Pre-procedure 11/09/2022  Lesly Rubenstein, MD  Primary Physician: Adin Hector, MD  Reason for visit:  Screening  History of present illness:    62 y/o lady with hypothyroidism here for screening colonoscopy. Last colonoscopy was normal. No blood thinners. Grandparent with colon cancer. History of umbilical hernia repair.    Current Facility-Administered Medications:    0.9 %  sodium chloride infusion, , Intravenous, Continuous, Tedi Hughson, Hilton Cork, MD, Last Rate: 20 mL/hr at 11/09/22 0927, Continued from Pre-op at 11/09/22 0927  Medications Prior to Admission  Medication Sig Dispense Refill Last Dose   aspirin 81 MG tablet Take 81 mg by mouth daily.   Past Week   b complex vitamins tablet Take 1 tablet by mouth daily.   Past Week   cetirizine (ZYRTEC) 10 MG tablet Take 10 mg by mouth daily.   Past Week   ibuprofen (ADVIL,MOTRIN) 800 MG tablet Take 1 tablet (800 mg total) by mouth every 8 (eight) hours as needed (mild pain). 60 tablet 0 Past Week   levothyroxine (SYNTHROID, LEVOTHROID) 100 MCG tablet Take 100 mcg by mouth daily before breakfast.   11/08/2022   Multiple Vitamin (MULTIVITAMIN) tablet Take 1 tablet by mouth daily.   Past Week   Omega-3 Fatty Acids (FISH OIL OMEGA-3 PO) Take 1 capsule by mouth daily.   Past Week   oxybutynin (DITROPAN) 5 MG tablet Take 5 mg by mouth 2 (two) times daily.        Allergies  Allergen Reactions   Penicillins Swelling    Has patient had a PCN reaction causing immediate rash, facial/tongue/throat swelling, SOB or lightheadedness with hypotension: Yes Has patient had a PCN reaction causing severe rash involving mucus membranes or skin necrosis: No Has patient had a PCN reaction that required hospitalization No Has patient had a PCN reaction occurring within the last 10 years: No If all of the above answers are "NO", then may proceed with Cephalosporin use.      Past Medical History:  Diagnosis Date    Anemia    Atypical mole    right posterior shoulder. Tx by Dr Sharlett Iles in the past.   Bronchitis    March   Cancer [facility])    breast - left   Hypothyroidism     Review of systems:  Otherwise negative.    Physical Exam  Gen: Alert, oriented. Appears stated age.  HEENT: PERRLA. Lungs: No respiratory distress CV: RRR Abd: soft, benign, no masses Ext: No edema    Planned procedures: Proceed with colonoscopy. The patient understands the nature of the planned procedure, indications, risks, alternatives and potential complications including but not limited to bleeding, infection, perforation, damage to internal organs and possible oversedation/side effects from anesthesia. The patient agrees and gives consent to proceed.  Please refer to procedure notes for findings, recommendations and patient disposition/instructions.     Lesly Rubenstein, MD [facility]

## 2022-11-09 NOTE — Interval H&P Note (Signed)
History and Physical Interval Note:  11/09/2022 9:34 AM  Judith West  has presented today for surgery, with the diagnosis of colon cancer screening.  The various methods of treatment have been discussed with the patient and family. After consideration of risks, benefits and other options for treatment, the patient has consented to  Procedure(s): COLONOSCOPY WITH PROPOFOL (N/A) as a surgical intervention.  The patient's history has been reviewed, patient examined, no change in status, stable for surgery.  I have reviewed the patient's chart and labs.  Questions were answered to the patient's satisfaction.     Lesly Rubenstein  Ok to proceed with colonoscopy

## 2022-11-09 NOTE — Anesthesia Postprocedure Evaluation (Signed)
Anesthesia Post Note  Patient: Judith West  Procedure(s) Performed: COLONOSCOPY WITH PROPOFOL  Patient location during evaluation: Endoscopy Anesthesia Type: General Level of consciousness: awake and alert Pain management: pain level controlled Vital Signs Assessment: post-procedure vital signs reviewed and stable Respiratory status: spontaneous breathing, nonlabored ventilation, respiratory function stable and patient connected to nasal cannula oxygen Cardiovascular status: blood pressure returned to baseline and stable Postop Assessment: no apparent nausea or vomiting Anesthetic complications: no   No notable events documented.   Last Vitals:  Vitals:   11/09/22 0957 11/09/22 1007  BP: 102/71 105/68  Pulse: 79 65  Resp: 11 11  Temp: (!) 36.1 C   SpO2: 99% 99%    Last Pain:  Vitals:   11/09/22 1007  TempSrc:   PainSc: 0-No pain                 Arita Miss

## 2022-11-12 ENCOUNTER — Encounter: Payer: Self-pay | Admitting: Gastroenterology

## 2023-01-29 ENCOUNTER — Ambulatory Visit: Payer: No Typology Code available for payment source | Admitting: Dermatology

## 2023-01-29 VITALS — BP 141/86

## 2023-01-29 DIAGNOSIS — L988 Other specified disorders of the skin and subcutaneous tissue: Secondary | ICD-10-CM

## 2023-01-29 DIAGNOSIS — L918 Other hypertrophic disorders of the skin: Secondary | ICD-10-CM

## 2023-01-29 DIAGNOSIS — Z1283 Encounter for screening for malignant neoplasm of skin: Secondary | ICD-10-CM

## 2023-01-29 DIAGNOSIS — L814 Other melanin hyperpigmentation: Secondary | ICD-10-CM | POA: Diagnosis not present

## 2023-01-29 DIAGNOSIS — D225 Melanocytic nevi of trunk: Secondary | ICD-10-CM

## 2023-01-29 DIAGNOSIS — L578 Other skin changes due to chronic exposure to nonionizing radiation: Secondary | ICD-10-CM

## 2023-01-29 DIAGNOSIS — W908XXA Exposure to other nonionizing radiation, initial encounter: Secondary | ICD-10-CM

## 2023-01-29 DIAGNOSIS — L821 Other seborrheic keratosis: Secondary | ICD-10-CM | POA: Diagnosis not present

## 2023-01-29 DIAGNOSIS — X32XXXA Exposure to sunlight, initial encounter: Secondary | ICD-10-CM

## 2023-01-29 DIAGNOSIS — D229 Melanocytic nevi, unspecified: Secondary | ICD-10-CM

## 2023-01-29 DIAGNOSIS — Z86018 Personal history of other benign neoplasm: Secondary | ICD-10-CM

## 2023-01-29 MED ORDER — TRETINOIN 0.05 % EX CREA: TOPICAL_CREAM | Freq: Every day | CUTANEOUS | 6 refills | Status: AC

## 2023-01-29 NOTE — Patient Instructions (Addendum)
Seborrheic Keratosis  What causes seborrheic keratoses? Seborrheic keratoses are harmless, common skin growths that first appear during adult life.  As time goes by, more growths appear.  Some people may develop a large number of them.  Seborrheic keratoses appear on both covered and uncovered body parts.  They are not caused by sunlight.  The tendency to develop seborrheic keratoses can be inherited.  They vary in color from skin-colored to gray, brown, or even black.  They can be either smooth or have a rough, warty surface.   Seborrheic keratoses are superficial and look as if they were stuck on the skin.  Under the microscope this type of keratosis looks like layers upon layers of skin.  That is why at times the top layer may seem to fall off, but the rest of the growth remains and re-grows.    Treatment Seborrheic keratoses do not need to be treated, but can easily be removed in the office.  Seborrheic keratoses often cause symptoms when they rub on clothing or jewelry.  Lesions can be in the way of shaving.  If they become inflamed, they can cause itching, soreness, or burning.  Removal of a seborrheic keratosis can be accomplished by freezing, burning, or surgery. If any spot bleeds, scabs, or grows rapidly, please return to have it checked, as these can be an indication of a skin cancer.  Topical retinoid medications like tretinoin/Retin-A, adapalene/Differin, tazarotene/Fabior, and Epiduo/Epiduo Forte can cause dryness and irritation when first started. Only apply a pea-sized amount to the entire affected area. Avoid applying it around the eyes, edges of mouth and creases at the nose. If you experience irritation, use a good moisturizer first and/or apply the medicine less often. If you are doing well with the medicine, you can increase how often you use it until you are applying every night. Be careful with sun protection while using this medication as it can make you sensitive to the sun. This  medicine should not be used by pregnant women.    Due to recent changes in healthcare laws, you may see results of your pathology and/or laboratory studies on MyChart before the doctors have had a chance to review them. We understand that in some cases there may be results that are confusing or concerning to you. Please understand that not all results are received at the same time and often the doctors may need to interpret multiple results in order to provide you with the best plan of care or course of treatment. Therefore, we ask that you please give [place] 2 business days to thoroughly review all your results before contacting the office for clarification. Should we see a critical lab result, you will be contacted sooner.   If You Need Anything After Your Visit  If you have any questions or concerns for your doctor, please call our main line at [phone] and press option 4 to reach your doctor's medical assistant. If no one answers, please leave a voicemail as directed and we will return your call as soon as possible. Messages left after 4 pm will be answered the following business day.   You may also send [place] a message via MyChart. We typically respond to MyChart messages within 1-2 business days.  For prescription refills, please ask your pharmacy to contact our office. Our fax number is [phone].  If you have an urgent issue when the clinic is closed that cannot wait until the next business day, you can page your doctor at the  number below.    Please note that while we do our best to be available for urgent issues outside of office hours, we are not available 24/7.   If you have an urgent issue and are unable to reach [place], you may choose to seek medical care at your doctor's office, retail clinic, urgent care center, or emergency room.  If you have a medical emergency, please immediately call 911 or go to the emergency department.  Pager Numbers  - Dr. Gwen Pounds: [phone]  - Dr.  Neale Burly: [phone]  - Dr. Roseanne Reno: [phone]  In the event of inclement weather, please call our main line at [phone] for an update on the status of any delays or closures.  Dermatology Medication Tips: Please keep the boxes that topical medications come in in order to help keep track of the instructions about where and how to use these. Pharmacies typically print the medication instructions only on the boxes and not directly on the medication tubes.   If your medication is too expensive, please contact our office at [phone] option 4 or send [place] a message through MyChart.   We are unable to tell what your co-pay for medications will be in advance as this is different depending on your insurance coverage. However, we may be able to find a substitute medication at lower cost or fill out paperwork to get insurance to cover a needed medication.   If a prior authorization is required to get your medication covered by your insurance company, please allow [place] 1-2 business days to complete this process.  Drug prices often vary depending on where the prescription is filled and some pharmacies may offer cheaper prices.  The website www.goodrx.com contains coupons for medications through different pharmacies. The prices here do not account for what the cost may be with help from insurance (it may be cheaper with your insurance), but the website can give you the price if you did not use any insurance.  - You can print the associated coupon and take it with your prescription to the pharmacy.  - You may also stop by our office during regular business hours and pick up a GoodRx coupon card.  - If you need your prescription sent electronically to a different pharmacy, notify our office through [facility] or by phone at [phone] option 4.     Si Usted Necesita Algo Despus de Su Visita  Tambin puede enviarnos un mensaje a travs de Clinical cytogeneticist. Por lo general respondemos a los mensajes  de MyChart en el transcurso de 1 a 2 das hbiles.  Para renovar recetas, por favor pida a su farmacia que se ponga en contacto con nuestra oficina. Annie Sable de fax es [place] [phone].  Si tiene un asunto urgente cuando la clnica est cerrada y que no puede esperar hasta el siguiente da hbil, puede llamar/localizar a su doctor(a) al nmero que aparece a continuacin.   Por favor, tenga en cuenta que aunque hacemos todo lo posible para estar disponibles para asuntos urgentes fuera del horario de [place], no estamos disponibles las 24 horas del da, los 7 [address]  [address] de la [place].   Si tiene un problema urgente y no puede comunicarse con nosotros, puede optar por buscar atencin mdica  en el consultorio de su doctor(a), en una clnica privada, en un centro de atencin urgente o en una sala de emergencias.  Si tiene Engineer, drilling, por favor llame inmediatamente al 911 o vaya a la sala de emergencias.  Nmeros de  bper  - Dr. Gwen Pounds: [phone]  - Dra. Moye: [phone]  - Dra. Roseanne Reno: [phone]  En caso de inclemencias del [place], por favor llame a Lacy Duverney principal al [phone] para una actualizacin sobre el [place] de cualquier retraso o cierre.  Consejos para la medicacin en dermatologa: Por favor, guarde las cajas en las que vienen los medicamentos de uso tpico para ayudarle a seguir las instrucciones sobre dnde y cmo usarlos. Las farmacias generalmente imprimen las instrucciones del medicamento slo en las cajas y no directamente en los tubos del [place].   Si su medicamento es muy caro, por favor, pngase en contacto con Rolm Gala llamando al [phone] y presione la opcin 4 o envenos un mensaje a travs de Clinical cytogeneticist.   No podemos decirle cul ser su copago por los medicamentos por adelantado ya que esto es diferente dependiendo de la cobertura de su seguro. Sin embargo, es posible que podamos encontrar un medicamento sustituto a Passenger transport manager un formulario para que el seguro cubra el medicamento que se considera necesario.   Si se requiere una autorizacin previa para que su compaa de seguros [place] su medicamento, por favor permtanos de 1 a 2 das hbiles para completar [address]  Los precios de los medicamentos varan con frecuencia dependiendo del Environmental consultant de dnde se surte la receta y alguna farmacias pueden ofrecer precios ms baratos.  El sitio web www.goodrx.com tiene cupones para medicamentos de Health and safety inspector. Los precios aqu no tienen en cuenta lo que podra costar con la ayuda del seguro (puede ser ms barato con su seguro), pero el sitio web puede darle el precio si no utiliz Tourist information centre manager.  - Puede imprimir el cupn correspondiente y llevarlo con su receta a la farmacia.  - Tambin puede pasar por nuestra oficina durante el horario de atencin regular y Education officer, museum una tarjeta de cupones de GoodRx.  - Si necesita que su receta se enve electrnicamente a una farmacia diferente, informe a nuestra oficina a travs de MyChart de [place] o por telfono llamando al [phone] y presione la opcin 4.

## 2023-01-29 NOTE — Progress Notes (Signed)
Follow-Up Visit   Subjective  Judith West is a 62 y.o. female who presents for the following: Skin Cancer Screening and Full Body Skin Exam, hx of Dysplastic nevus  The patient presents for Total-Body Skin Exam (TBSE) for skin cancer screening and mole check. The patient has spots, moles and lesions to be evaluated, some may be new or changing and the patient has concerns that these could be cancer.    The following portions of the chart were reviewed this encounter and updated as appropriate: medications, allergies, medical history  Review of Systems:  No other skin or systemic complaints except as noted in HPI or Assessment and Plan.  Objective  Well appearing patient in no apparent distress; mood and affect are within normal limits.  A full examination was performed including scalp, head, eyes, ears, nose, lips, neck, chest, axillae, abdomen, back, buttocks, bilateral upper extremities, bilateral lower extremities, hands, feet, fingers, toes, fingernails, and toenails. All findings within normal limits unless otherwise noted below.   Relevant physical exam findings are noted in the Assessment and Plan.    Assessment & Plan   LENTIGINES, SEBORRHEIC KERATOSES, HEMANGIOMAS - Benign normal skin lesions - Benign-appearing - Call for any changes  MELANOCYTIC NEVI - Tan-brown and/or pink-flesh-colored symmetric macules and papules - Benign appearing on exam today - Observation - Call clinic for new or changing moles - Recommend daily use of broad spectrum spf 30+ sunscreen to sun-exposed areas.  NEVI Exam:  - Mid upper back med dark brown macules, stable compared to photos from 01/17/22  Treatment Plan: Benign-appearing. Stable compared to previous visit. Observation.  Call clinic for new or changing moles.  Recommend daily use of broad spectrum spf 30+ sunscreen to sun-exposed areas.     ACTINIC DAMAGE - Chronic condition, secondary to cumulative UV/sun exposure - diffuse  scaly erythematous macules with underlying dyspigmentation - Recommend daily broad spectrum sunscreen SPF 30+ to sun-exposed areas, reapply every 2 hours as needed.  - Staying in the shade or wearing long sleeves, sun glasses (UVA+UVB protection) and wide brim hats (4-inch brim around the entire circumference of the hat) are also recommended for sun protection.  - Call for new or changing lesions.  SKIN CANCER SCREENING PERFORMED TODAY.  Acrochordons (Skin Tags) - Fleshy, skin-colored pedunculated papules - Benign appearing.  - Observe. - If desired, they can be removed with an in office procedure that is not covered by insurance. - Please call the clinic if you notice any new or changing lesions.  - anterior neck  HISTORY OF DYSPLASTIC NEVUS No evidence of recurrence today Recommend regular full body skin exams Recommend daily broad spectrum sunscreen SPF 30+ to sun-exposed areas, reapply every 2 hours as needed.  Call if any new or changing lesions are noted between office visits  - R post shoulder  FACIAL ELASTOSIS Exam: Rhytides and volume loss.  Treatment Plan: Discussed Tretinoin  Start Tretinoin 0.025% cr qhs to face  Recommend daily broad spectrum sunscreen SPF 30+ to sun-exposed areas, reapply every 2 hours as needed. Call for new or changing lesions.  Staying in the shade or wearing long sleeves, sun glasses (UVA+UVB protection) and wide brim hats (4-inch brim around the entire circumference of the hat) are also recommended for sun protection.    Topical retinoid medications like tretinoin/Retin-A, adapalene/Differin, tazarotene/Fabior, and Epiduo/Epiduo Forte can cause dryness and irritation when first started. Only apply a pea-sized amount to the entire affected area. Avoid applying it around the eyes, edges  of mouth and creases at the nose. If you experience irritation, use a good moisturizer first and/or apply the medicine less often. If you are doing well with the  medicine, you can increase how often you use it until you are applying every night. Be careful with sun protection while using this medication as it can make you sensitive to the sun. This medicine should not be used by pregnant women.    Return in about 1 year (around 01/29/2024) for TBSE, Hx of Dysplastic nevi.  I, Ardis Rowan, RMA, am acting as scribe for Willeen Niece, MD .   Documentation: I have reviewed the above documentation for accuracy and completeness, and I agree with the above.  Willeen Niece, MD

## 2023-01-30 ENCOUNTER — Telehealth: Payer: Self-pay

## 2023-01-30 NOTE — Telephone Encounter (Signed)
PA for Tretinoin 0.05% cream denied by insurance. Patient paid out of pocket and has already picked up prescription.

## 2023-03-21 ENCOUNTER — Other Ambulatory Visit: Payer: Self-pay | Admitting: Internal Medicine

## 2023-03-21 DIAGNOSIS — Z0000 Encounter for general adult medical examination without abnormal findings: Secondary | ICD-10-CM

## 2023-03-21 DIAGNOSIS — R7303 Prediabetes: Secondary | ICD-10-CM

## 2023-03-21 DIAGNOSIS — E785 Hyperlipidemia, unspecified: Secondary | ICD-10-CM

## 2023-04-10 ENCOUNTER — Ambulatory Visit
Admission: RE | Admit: 2023-04-10 | Discharge: 2023-04-10 | Disposition: A | Discharge status: Home / Self Care | Payer: No Typology Code available for payment source | Source: Ambulatory Visit | Attending: Internal Medicine | Admitting: Internal Medicine

## 2023-04-10 DIAGNOSIS — R7303 Prediabetes: Secondary | ICD-10-CM | POA: Insufficient documentation

## 2023-04-10 DIAGNOSIS — E785 Hyperlipidemia, unspecified: Secondary | ICD-10-CM | POA: Insufficient documentation

## 2023-04-10 DIAGNOSIS — Z0000 Encounter for general adult medical examination without abnormal findings: Secondary | ICD-10-CM | POA: Insufficient documentation

## 2023-10-06 ENCOUNTER — Ambulatory Visit
Admission: EM | Admit: 2023-10-06 | Discharge: 2023-10-06 | Disposition: A | Discharge status: Home / Self Care | Payer: No Typology Code available for payment source | Attending: Emergency Medicine | Admitting: Emergency Medicine

## 2023-10-06 DIAGNOSIS — B349 Viral infection, unspecified: Secondary | ICD-10-CM

## 2023-10-06 LAB — POC COVID19/FLU A&B COMBO
Covid Antigen, POC: NEGATIVE
Influenza A Antigen, POC: NEGATIVE
Influenza B Antigen, POC: NEGATIVE

## 2023-10-06 MED ORDER — ONDANSETRON 4 MG PO TBDP
4.0000 mg | ORAL_TABLET | Freq: Once | ORAL | Status: AC
  Administered 2023-10-06: 4 mg via ORAL

## 2023-10-06 MED ORDER — ONDANSETRON 4 MG PO TBDP: 4.0000 mg | ORAL_TABLET | Freq: Three times a day (TID) | ORAL | 0 refills | Status: AC | PRN

## 2023-10-06 NOTE — Discharge Instructions (Addendum)
 The COVID and flu tests are negative.   Take the antinausea medication as directed.  Keep yourself hydrated with clear liquids, such as water.    Take Tylenol  as needed for fever or discomfort.   Go to the emergency department if you have worsening symptoms.    Follow up with your primary care provider.

## 2023-10-06 NOTE — ED Provider Notes (Signed)
 Judith West    CSN: [id number] Arrival date & time: 10/06/23  0827      History   Chief Complaint Chief Complaint  Patient presents with   Fever    HPI Judith West is a 63 y.o. female.  Patient presents with 1 day history of fever, chills, headache, sore throat, cough, vomiting.  Last emesis occurred this morning.  Tmax 102.3.  She has been treating her symptoms with Advil .  No shortness of breath or diarrhea.  The history is provided by the patient and medical records.    Past Medical History:  Diagnosis Date   Anemia    Atypical mole    right posterior shoulder. Tx by Dr Jakie in the past.   Bronchitis    March   Cancer [facility])    breast - left   Hypothyroidism     Patient Active Problem List   Diagnosis Date Noted   Post-operative state 02/20/2016    Past Surgical History:  Procedure Laterality Date   BREAST RECONSTRUCTION     implants   COLONOSCOPY WITH PROPOFOL  N/A 11/09/2022   Procedure: COLONOSCOPY WITH PROPOFOL ;  Surgeon: Judith Ole DASEN, MD;  Location: [facility] ENDOSCOPY;  Service: Endoscopy;  Laterality: N/A;   DILATION AND CURETTAGE OF UTERUS N/A 01/06/2016   Procedure: DILATATION AND CURETTAGE;  Surgeon: Judith JINNY Dinsmore, MD;  Location: [facility] ORS;  Service: Gynecology;  Laterality: N/A;   HERNIA REPAIR     HYSTEROSCOPY WITH D & C N/A 02/14/2015   Procedure: DILATATION AND CURETTAGE /HYSTEROSCOPY;  Surgeon: Judith JINNY Dinsmore, MD;  Location: [facility] ORS;  Service: Gynecology;  Laterality: N/A;   MASTECTOMY Bilateral 2014   RECTOCELE REPAIR N/A 02/20/2016   Procedure: POSTERIOR REPAIR (RECTOCELE);  Surgeon: Judith JINNY Dinsmore, MD;  Location: [facility] ORS;  Service: Gynecology;  Laterality: N/A;   SALPINGOOPHORECTOMY Bilateral 02/20/2016   Procedure: SALPINGO OOPHORECTOMY;  Surgeon: Judith JINNY Dinsmore, MD;  Location: [facility] ORS;  Service: Gynecology;  Laterality: Bilateral;   TONSILLECTOMY     TUBAL LIGATION     uterine ablation      VAGINAL HYSTERECTOMY N/A 02/20/2016   Procedure: HYSTERECTOMY VAGINAL;  Surgeon: Judith JINNY Dinsmore, MD;  Location: [facility] ORS;  Service: Gynecology;  Laterality: N/A;    OB History   No obstetric history on file.      Home Medications    Prior to Admission medications   Medication Sig Start Date End Date Taking? Authorizing Provider  ondansetron  (ZOFRAN -ODT) 4 MG disintegrating tablet Take 1 tablet (4 mg total) by mouth every 8 (eight) hours as needed for nausea or vomiting. 10/06/23  Yes Corlis Burnard VEAR, NP  aspirin 81 MG tablet Take 81 mg by mouth daily.    [provider]  b complex vitamins tablet Take 1 tablet by mouth daily.    [provider]  cetirizine (ZYRTEC) 10 MG tablet Take 10 mg by mouth daily.    [provider]  ibuprofen  (ADVIL ,MOTRIN ) 800 MG tablet Take 1 tablet (800 mg total) by mouth every 8 (eight) hours as needed (mild pain). 02/21/16   Schermerhorn, Judith JINNY, MD  levothyroxine (SYNTHROID, LEVOTHROID) 100 MCG tablet Take 100 mcg by mouth daily before breakfast.    [provider]  Multiple Vitamin (MULTIVITAMIN) tablet Take 1 tablet by mouth daily.    [provider]  Omega-3 Fatty Acids (FISH OIL OMEGA-3 PO) Take 1 capsule by mouth daily.    [provider]  oxybutynin (DITROPAN) 5 MG tablet  Take 5 mg by mouth 2 (two) times daily. 12/04/21   [provider]  tretinoin  (RETIN-A ) 0.05 % cream Apply topically at bedtime. Qhs to face, pea size amount 01/29/23 01/29/24  Judith Sawyer, MD    Family History History reviewed. No pertinent family history.  Social History Social History   Tobacco Use   Smoking status: Never   Smokeless tobacco: Never  Vaping Use   Vaping status: Never Used  Substance Use Topics   Alcohol use: Yes    Comment: occ wine   Drug use: No     Allergies   Penicillins   Review of Systems Review of Systems  Constitutional:  Positive for chills and fever.  HENT:  Positive for  sore throat. Negative for ear pain.   Respiratory:  Positive for cough. Negative for shortness of breath.   Gastrointestinal:  Positive for vomiting. Negative for diarrhea.  Neurological:  Positive for headaches.     Physical Exam Triage Vital Signs ED Triage Vitals  Encounter Vitals Group     BP 10/06/23 0947 127/81     Systolic BP Percentile --      Diastolic BP Percentile --      Pulse Rate 10/06/23 0947 79     Resp 10/06/23 0947 18     Temp 10/06/23 0947 99.6 F (37.6 C)     Temp src --      SpO2 10/06/23 0947 95 %     Weight --      Height --      Head Circumference --      Peak Flow --      Pain Score 10/06/23 0956 5     Pain Loc --      Pain Education --      Exclude from Growth Chart --    No data found.  Updated Vital Signs BP 127/81   Pulse 79   Temp 99.6 F (37.6 C)   Resp 18   LMP 02/10/2015 Comment: upreg neg  SpO2 95%   Visual Acuity Right Eye Distance:   Left Eye Distance:   Bilateral Distance:    Right Eye Near:   Left Eye Near:    Bilateral Near:     Physical Exam Constitutional:      General: She is not in acute distress. HENT:     Right Ear: Tympanic membrane normal.     Left Ear: Tympanic membrane normal.     Nose: Nose normal.     Mouth/Throat:     Mouth: Mucous membranes are moist.     Pharynx: Oropharynx is clear.  Cardiovascular:     Rate and Rhythm: Normal rate and regular rhythm.     Heart sounds: Normal heart sounds.  Pulmonary:     Effort: Pulmonary effort is normal. No respiratory distress.     Breath sounds: Normal breath sounds.  Abdominal:     General: Bowel sounds are normal.     Palpations: Abdomen is soft.     Tenderness: There is no abdominal tenderness. There is no guarding or rebound.  Skin:    General: Skin is warm and dry.  Neurological:     Mental Status: She is alert.      UC Treatments / Results  Labs (all labs ordered are listed, but only abnormal results are displayed) Labs Reviewed  POC  COVID19/FLU A&B COMBO    EKG   Radiology No results found.  Procedures Procedures (including critical care time)  Medications Ordered  in UC Medications  ondansetron  (ZOFRAN -ODT) disintegrating tablet 4 mg (4 mg Oral Given 10/06/23 1003)    Initial Impression / Assessment and Plan / UC Course  I have reviewed the triage vital signs and the nursing notes.  Pertinent labs & imaging results that were available during my care of the patient were reviewed by me and considered in my medical decision making (see chart for details).    Viral illness.  Afebrile and vital signs are stable.  Lungs are clear and O2 sat is 95% on room air.  Rapid flu and COVID-negative.  Treating nausea and vomiting with Zofran .  Discussed clear liquid diet.  Instructed patient to advance diet as tolerated. Always needed for fever or discomfort.  ED precautions discussed.  Education provided on viral illness.  Instructed patient to follow-up with her PCP.  She agrees to plan of care.   Final Clinical Impressions(s) / UC Diagnoses   Final diagnoses:  Viral illness     Discharge Instructions      The COVID and flu tests are negative.   Take the antinausea medication as directed.  Keep yourself hydrated with clear liquids, such as water.    Take Tylenol  as needed for fever or discomfort.   Go to the emergency department if you have worsening symptoms.    Follow up with your primary care provider.        ED Prescriptions     Medication Sig Dispense Auth. Provider   ondansetron  (ZOFRAN -ODT) 4 MG disintegrating tablet Take 1 tablet (4 mg total) by mouth every 8 (eight) hours as needed for nausea or vomiting. 20 tablet Corlis Burnard DEL, NP      PDMP not reviewed this encounter.   Corlis Burnard DEL, NP 10/06/23 1024

## 2023-10-06 NOTE — ED Notes (Signed)
 Attempt to call 9:27

## 2023-10-06 NOTE — ED Triage Notes (Signed)
 Patient to Urgent Care with complaints of dry cough/ fevers (102.3)/ sore throat/ emesis/ chills/ headaches.  Reports symptoms started yesterday afternoon.   Meds: Advil .

## 2023-10-20 ENCOUNTER — Encounter: Payer: Self-pay | Admitting: *Deleted

## 2023-10-20 ENCOUNTER — Ambulatory Visit
Admission: EM | Admit: 2023-10-20 | Discharge: 2023-10-20 | Disposition: A | Discharge status: Home / Self Care | Payer: No Typology Code available for payment source | Attending: Emergency Medicine | Admitting: Emergency Medicine

## 2023-10-20 DIAGNOSIS — J069 Acute upper respiratory infection, unspecified: Secondary | ICD-10-CM | POA: Diagnosis not present

## 2023-10-20 MED ORDER — AZITHROMYCIN 250 MG PO TABS: 250.0000 mg | ORAL_TABLET | Freq: Every day | ORAL | 0 refills | Status: AC

## 2023-10-20 NOTE — Discharge Instructions (Addendum)
 Take the Zithromax as directed.  Follow up with your primary care provider.

## 2023-10-20 NOTE — ED Triage Notes (Signed)
 Patient states seen 2 weeks ago for V/D, now with cough for 5 days with yellow/green nasal congestion and bleeding when blowing nose.  Has taken Mucinex, Advil

## 2023-10-20 NOTE — ED Provider Notes (Signed)
 Renaldo Fiddler    CSN: [id number] Arrival date & time: 10/20/23  0801      History   Chief Complaint Chief Complaint  Patient presents with   Cough    HPI Judith West is a 63 y.o. female.  Patient presents with 2-week history of congestion, sinus pressure, postnasal drip, runny nose, cough.  No shortness of breath.  Several OTC cold medications attempted.  She was seen here on 10/06/2023; diagnosed with viral illness; treated symptomatically, including Zofran for nausea vomiting.  The history is provided by the patient and medical records.    Past Medical History:  Diagnosis Date   Anemia    Atypical mole    right posterior shoulder. Tx by Dr Jarold Motto in the past.   Bronchitis    March   Cancer [facility])    breast - left   Hypothyroidism     Patient Active Problem List   Diagnosis Date Noted   Post-operative state 02/20/2016    Past Surgical History:  Procedure Laterality Date   BREAST RECONSTRUCTION     implants   COLONOSCOPY WITH PROPOFOL N/A 11/09/2022   Procedure: COLONOSCOPY WITH PROPOFOL;  Surgeon: Regis Bill, MD;  Location: [facility] END[facility];  Service: Endoscopy;  Laterality: N/A;   DILATION AND CURETTAGE OF UTERUS N/A 01/06/2016   Procedure: DILATATION AND CURETTAGE;  Surgeon: Suzy Bouchard, MD;  Location: [facility] ORS;  Service: Gynecology;  Laterality: N/A;   HERNIA REPAIR     HYSTEROSCOPY WITH D & C N/A 02/14/2015   Procedure: DILATATION AND CURETTAGE /HYSTEROSCOPY;  Surgeon: Suzy Bouchard, MD;  Location: [facility] OR[facility];  Service: Gynecology;  Laterality: N/A;   MASTECTOMY Bilateral 2014   RECTOCELE REPAIR N/A 02/20/2016   Procedure: POSTERIOR REPAIR (RECTOCELE);  Surgeon: Suzy Bouchard, MD;  Location: [facility] OR[facility];  Service: Gynecology;  Laterality: N/A;   SALPINGOOPHORECTOMY Bilateral 02/20/2016   Procedure: SALPINGO OOPHORECTOMY;  Surgeon: Suzy Bouchard, MD;  Location: [facility];  Service: Gynecology;  Laterality: Bilateral;    TONSILLECTOMY     TUBAL LIGATION     uterine ablation     VAGINAL HYSTERECTOMY N/A 02/20/2016   Procedure: HYSTERECTOMY VAGINAL;  Surgeon: Suzy Bouchard, MD;  Location: [facility] OR[facility];  Service: Gynecology;  Laterality: N/A;    OB History   No obstetric history on file.      Home Medications    Prior to Admission medications   Medication Sig Start Date End Date Taking? Authorizing Provider  azithromycin (ZITHROMAX) 250 MG tablet Take 1 tablet (250 mg total) by mouth daily. Take first 2 tablets together, then 1 every day until finished. 10/20/23  Yes Mickie Bail, NP  aspirin 81 MG tablet Take 81 mg by mouth daily.    [provider]  b complex vitamins tablet Take 1 tablet by mouth daily.    [provider]  cetirizine (ZYRTEC) 10 MG tablet Take 10 mg by mouth daily.    [provider]  ibuprofen (ADVIL,MOTRIN) 800 MG tablet Take 1 tablet (800 mg total) by mouth every 8 (eight) hours as needed (mild pain). 02/21/16   Schermerhorn, Ihor Austin, MD  levothyroxine (SYNTHROID, LEVOTHROID) 100 MCG tablet Take 100 mcg by mouth daily before breakfast.    [provider]  Multiple Vitamin (MULTIVITAMIN) tablet Take 1 tablet by mouth daily.    [provider]  Omega-3 Fatty Acids (FISH OIL OMEGA-3 PO) Take 1 capsule by mouth daily.    [provider]  ondansetron (ZOFRAN-ODT) 4 MG disintegrating tablet Take 1 tablet (4 mg total) by mouth every 8 (eight) hours as needed for nausea or vomiting. 10/06/23   Mickie Bail, NP  oxybutynin (DITROPAN) 5 MG tablet Take 5 mg by mouth 2 (two) times daily. 12/04/21   [provider]  tretinoin (RETIN-A) 0.05 % cream Apply topically at bedtime. Qhs to face, pea size amount 01/29/23 01/29/24  Willeen Niece, MD    Family History History reviewed. No pertinent family history.  Social History Social History   Tobacco Use   Smoking status: Never   Smokeless tobacco: Never  Vaping Use   Vaping status:  Never Used  Substance Use Topics   Alcohol use: Yes    Comment: occ wine   Drug use: No     Allergies   Penicillins   Review of Systems Review of Systems  Constitutional:  Negative for chills and fever.  HENT:  Positive for congestion, postnasal drip, rhinorrhea and sinus pressure. Negative for ear pain and sore throat.   Respiratory:  Positive for cough. Negative for shortness of breath.   Gastrointestinal:  Negative for diarrhea, nausea and vomiting.     Physical Exam Triage Vital Signs ED Triage Vitals  Encounter Vitals Group     BP      Systolic BP Percentile      Diastolic BP Percentile      Pulse      Resp      Temp      Temp src      SpO2      Weight      Height      Head Circumference      Peak Flow      Pain Score      Pain Loc      Pain Education      Exclude from Growth Chart    No data found.  Updated Vital Signs BP 133/79 (BP Location: Right Arm)   Pulse 69   Temp 98.3 F (36.8 C) (Oral)   Resp 18   Ht 5\' 2"  (1.575 m)   Wt 126 lb (57.2 kg)   LMP 02/10/2015 Comment: upreg neg  SpO2 96%   BMI 23.05 kg/m   Visual Acuity Right Eye Distance:   Left Eye Distance:   Bilateral Distance:    Right Eye Near:   Left Eye Near:    Bilateral Near:     Physical Exam Constitutional:      General: She is not in acute distress. HENT:     Right Ear: Tympanic membrane normal.     Left Ear: Tympanic membrane normal.     Nose: Congestion and rhinorrhea present.     Mouth/Throat:     Mouth: Mucous membranes are moist.     Pharynx: Oropharynx is clear.  Cardiovascular:     Rate and Rhythm: Normal rate and regular rhythm.     Heart sounds: Normal heart sounds.  Pulmonary:     Effort: Pulmonary effort is normal. No respiratory distress.     Breath sounds: Normal breath sounds.  Neurological:     Mental Status: She is alert.      UC Treatments / Results  Labs (all labs ordered are listed, but only abnormal results are displayed) Labs  Reviewed - No data to display  EKG   Radiology No results found.  Procedures Procedures (including critical care time)  Medications Ordered in UC Medications - No data to display  Initial  Impression / Assessment and Plan / UC Course  I have reviewed the triage vital signs and the nursing notes.  Pertinent labs & imaging results that were available during my care of the patient were reviewed by me and considered in my medical decision making (see chart for details).    Acute upper respiratory infection.  Afebrile and vital signs are stable.  Patient is allergic to penicillin.  Treating today with Zithromax.  Instructed her to follow-up with her PCP on Monday.  ED precautions given.  Education provided on URI.  She agrees to plan of care.  Final Clinical Impressions(s) / UC Diagnoses   Final diagnoses:  Acute upper respiratory infection     Discharge Instructions      Take the Zithromax as directed.  Follow-up with your primary care provider.      ED Prescriptions     Medication Sig Dispense Auth. Provider   azithromycin (ZITHROMAX) 250 MG tablet Take 1 tablet (250 mg total) by mouth daily. Take first 2 tablets together, then 1 every day until finished. 6 tablet Mickie Bail, NP      PDMP not reviewed this encounter.   Mickie Bail, NP 10/20/23 [phone]

## 2024-02-11 ENCOUNTER — Ambulatory Visit: Payer: No Typology Code available for payment source | Admitting: Dermatology

## 2024-09-10 ENCOUNTER — Other Ambulatory Visit: Payer: Self-pay | Admitting: Neurology

## 2024-09-10 DIAGNOSIS — M25642 Stiffness of left hand, not elsewhere classified: Secondary | ICD-10-CM

## 2024-09-14 ENCOUNTER — Ambulatory Visit: Admission: RE | Admit: 2024-09-14 | Source: Ambulatory Visit
